# Patient Record
Sex: Male | Born: 2000 | Race: White | Hispanic: No | Marital: Single | Smoking: Never smoker
Health system: Southern US, Community
[De-identification: ages and names within clinical notes are randomized; demographics above are authoritative.]

## PROBLEM LIST (undated history)

## (undated) ENCOUNTER — Ambulatory Visit (HOSPITAL_COMMUNITY): Admission: EM

## (undated) DIAGNOSIS — I10 Essential (primary) hypertension: Secondary | ICD-10-CM

## (undated) DIAGNOSIS — R030 Elevated blood-pressure reading, without diagnosis of hypertension: Secondary | ICD-10-CM

## (undated) DIAGNOSIS — D573 Sickle-cell trait: Secondary | ICD-10-CM

## (undated) HISTORY — PX: NO PAST SURGERIES: SHX2092

---
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
## 2019-02-13 ENCOUNTER — Ambulatory Visit (INDEPENDENT_AMBULATORY_CARE_PROVIDER_SITE_OTHER): Payer: BC Managed Care – PPO

## 2019-02-13 ENCOUNTER — Other Ambulatory Visit: Payer: Self-pay

## 2019-02-13 ENCOUNTER — Ambulatory Visit
Admission: EM | Admit: 2019-02-13 | Discharge: 2019-02-13 | Disposition: A | Discharge status: Home / Self Care | Payer: BC Managed Care – PPO | Attending: Family Medicine | Admitting: Family Medicine

## 2019-02-13 ENCOUNTER — Encounter: Payer: Self-pay | Admitting: Emergency Medicine

## 2019-02-13 DIAGNOSIS — R0789 Other chest pain: Secondary | ICD-10-CM | POA: Diagnosis not present

## 2019-02-13 DIAGNOSIS — J029 Acute pharyngitis, unspecified: Secondary | ICD-10-CM

## 2019-02-13 DIAGNOSIS — Z20828 Contact with and (suspected) exposure to other viral communicable diseases: Secondary | ICD-10-CM | POA: Diagnosis not present

## 2019-02-13 DIAGNOSIS — R05 Cough: Secondary | ICD-10-CM

## 2019-02-13 DIAGNOSIS — J069 Acute upper respiratory infection, unspecified: Secondary | ICD-10-CM | POA: Diagnosis not present

## 2019-02-13 DIAGNOSIS — R519 Headache, unspecified: Secondary | ICD-10-CM

## 2019-02-13 DIAGNOSIS — Z7189 Other specified counseling: Secondary | ICD-10-CM | POA: Diagnosis not present

## 2019-02-13 DIAGNOSIS — Z20822 Contact with and (suspected) exposure to covid-19: Secondary | ICD-10-CM

## 2019-02-13 LAB — RAPID STREP SCREEN (MED CTR MEBANE ONLY): Streptococcus, Group A Screen (Direct): NEGATIVE

## 2019-02-13 MED ORDER — ALBUTEROL SULFATE HFA 108 (90 BASE) MCG/ACT IN AERS: 2.0000 | INHALATION_SPRAY | RESPIRATORY_TRACT | 0 refills | Status: DC | PRN

## 2019-02-13 NOTE — Discharge Instructions (Signed)
It was very nice seeing you today in clinic. Thank you for entrusting me with your care.  ° °Rest and Increase fluid intake as much as possible. Water is always best, as sugar and caffeine containing fluids can cause you to become dehydrated. Try to incorporate electrolyte enriched fluids, such as Gatorade or Pedialyte, into your daily fluid intake.  May use Tylenol and/or Ibuprofen as needed for pain/fever.  ° °You were tested for SARS-CoV-2 (novel coronavirus) today. Testing is performed by an outside lab ([facility]) and has variable turn around times ranging between 2-5 days. Current recommendations from the the CDC and [place] DHHS require that you remain out of work in order to quarantine at home until negative test results are have been received. In the event that your test results are positive, you will be contacted with further directives. These measures are being implemented out of an abundance of caution to prevent transmission and spread during the current SARS-CoV-2 pandemic. ° °Make arrangements to follow up with your regular doctor in 1 week for re-evaluation if not improving. If your symptoms/condition worsens, please seek follow up care either here or in the ER. Please remember, our [facility] providers are "right here with you" when you need us.  ° °Again, it was my pleasure to take care of you today. Thank you for choosing our clinic. I hope that you start to feel better quickly.  ° °Aireona Torelli, MSN, APRN, FNP-C, CEN °Advanced Practice Provider °[facility] °

## 2019-02-13 NOTE — ED Provider Notes (Signed)
[place], [place]   Name: Christopher Andrade DOB: 10-16-2000 MRN: [id number] CSN: [id number] PCP: Herb Grays, MD  Arrival date and time:  02/13/19 1430  Chief Complaint:  Headache, Generalized Body Aches, and Fever   NOTE: Prior to seeing the patient today, I have reviewed the triage nursing documentation and vital signs. Clinical staff has updated patient's PMH/PSHx, current medication list, and drug allergies/intolerances to ensure comprehensive history available to assist in medical decision making.   History:   HPI: Christopher Andrade is a 18 y.o. male who presents today with complaints of cough, sore throat, diffuse body myalgias, chest tightness, and low-grade fevers (Tmax 99.5) for the last 2 to 3 days.  With a past medical history significant for allergies, which is what he has attributed his symptoms to over the last few days; takes daily cetirizine.  Symptoms worsened last night.  He denies any pain in his ears or sinus pressure/pain. He denies that he has experienced any nausea, vomiting, diarrhea, or abdominal pain. He is eating and drinking well. Patient denies any perceived alterations to his sense of taste of smell.  Patient is employed by a [employer]) and is therefore in contact with many people on a daily basis.  He presents with concerns related to SARS-CoV-2 (novel coronavirus); he has never been tested per his report.  Past Medical History:  Diagnosis Date  . Sickle cell trait (HCC)     History reviewed. No pertinent surgical history.  Family History  Problem Relation Age of Onset  . Healthy Mother   . Hypertension Father     Social History   Tobacco Use  . Smoking status: Never Smoker  . Smokeless tobacco: Never Used  Substance Use Topics  . Alcohol use: No  . Drug use: No    There are no active problems to display for this patient.   Home Medications:    No outpatient medications have been marked as taking for the 02/13/19 encounter  [facility] Encounter).    Allergies:   Patient has no known allergies.  Review of Systems (ROS): Review of Systems  Constitutional: Positive for fever (Tmax 99.5). Negative for fatigue.  HENT: Positive for sore throat. Negative for congestion, drooling, ear pain, postnasal drip, rhinorrhea, sinus pressure, sinus pain, sneezing and trouble swallowing.   Eyes: Negative for pain, discharge and redness.  Respiratory: Positive for cough, chest tightness and wheezing. Negative for shortness of breath.   Cardiovascular: Negative for chest pain and palpitations.  Gastrointestinal: Negative for abdominal pain, diarrhea, nausea and vomiting.  Musculoskeletal: Positive for myalgias. Negative for arthralgias, back pain and neck pain.  Skin: Negative for color change, pallor and rash.  Neurological: Negative for dizziness, syncope, weakness and headaches.  Hematological: Positive for adenopathy.  Psychiatric/Behavioral: The patient is nervous/anxious.      Vital Signs: Today's Vitals   02/13/19 1438 02/13/19 1441 02/13/19 1543  BP:  (!) 157/84   Pulse:  88   Resp:  16   Temp:  98.2 F (36.8 C)   TempSrc:  Oral   SpO2:  100%   Weight: 185 lb (83.9 kg)    Height: 6\' 1"  (1.854 m)    PainSc: 3   3     Physical Exam: Physical Exam  Constitutional: He is oriented to person, place, and time and well-developed, well-nourished, and in no distress. No distress.  HENT:  Head: Normocephalic and atraumatic.  Right Ear: Tympanic membrane normal.  Left Ear: Tympanic membrane normal.  Nose: Mucosal edema and rhinorrhea (clear) present. No sinus tenderness.  Mouth/Throat: Uvula is midline and mucous membranes are normal. Oropharyngeal exudate (single small white patch to RIGHT tonsil) and posterior oropharyngeal erythema present. No posterior oropharyngeal edema.  Eyes: Pupils are equal, round, and reactive to light. Conjunctivae and EOM are normal.  Neck: Normal range of motion. Neck supple. No  tracheal deviation present.  Cardiovascular: Normal rate, regular rhythm, normal heart sounds and intact distal pulses. Exam reveals no gallop and no friction rub.  No murmur heard. Pulmonary/Chest: Effort normal. No respiratory distress. He has no decreased breath sounds. He has wheezes (expiratory). He has no rhonchi. He has no rales.  Abdominal: Soft. Normal appearance and bowel sounds are normal. He exhibits no distension. There is no hepatosplenomegaly. There is no abdominal tenderness.  Musculoskeletal: Normal range of motion.  Lymphadenopathy:       Head (right side): Submandibular adenopathy present.       Head (left side): Submandibular adenopathy present.  Neurological: He is alert and oriented to person, place, and time. Gait normal.  Skin: Skin is warm and dry. No rash noted. He is not diaphoretic.  Psychiatric: Memory, affect and judgment normal. His mood appears anxious.  Nursing note and vitals reviewed.   Urgent Care Treatments / Results:   LABS: PLEASE NOTE: all labs that were ordered this encounter are listed, however only abnormal results are displayed. Labs Reviewed  NOVEL CORONAVIRUS, NAA (HOSP ORDER, SEND-OUT TO REF LAB; TAT 18-24 HRS)  RAPID STREP SCREEN ([facility] [place] ONLY)  CULTURE, GROUP A STREP [facility])    EKG: -None  RADIOLOGY: No results found.  PROCEDURES: Procedures  MEDICATIONS RECEIVED THIS VISIT: Medications - No data to display  PERTINENT CLINICAL COURSE NOTES/UPDATES:   Initial Impression / Assessment and Plan / Urgent Care Course:  Pertinent labs & imaging results that were available during my care of the patient were personally reviewed by me and considered in my medical decision making (see lab/imaging section of note for values and interpretations).  Christopher Andrade is a 18 y.o. male who [facility] with complaints of Headache, Generalized Body Aches, and Fever   Patient overall well appearing and in no acute  distress today in clinic. Presenting symptoms (see HPI) and exam as documented above. He presents with symptoms associated with SARS-CoV-2 (novel coronavirus). Discussed typical symptom constellation. Reviewed potential for infection and need for testing. Patient amenable to being tested. SARS-CoV-2 swab collected by certified clinical staff. Discussed variable turn around times associated with testing, as sw[facility] are being [facility], and have been taking between 2-5 days to come back. He was advised to self quarantine, per [facility] DH[facility] guidelines, until negative results received.   Rapid streptococcal throat swab (-); reflex culture sent. Radiographs of the chest performed today revealed no acute cardiopulmonary process; no evidence of peribronchial thickening, areas of consolidation, or focal infiltrates. Symptoms consistent with viral illness with cough. SARS-CoV-2 remains part of the differential.  Offered anti-tussive medication to help with cough, however patient declined and is opting to use OTC interventions at this point. Exam revealed expiratory wheezing. Will send in SABA (albuterol) MDI for PRN use. I discussed with him that his symptoms are felt to be viral in nature, thus antibiotics would not offer him any relief or improve his symptoms any faster than conservative symptomatic management. Discussed supportive care measures at home during acute phase of illness. Patient to rest as much as possible. He was  encouraged to ensure adequate hydration (water and ORS) to prevent dehydration and electrolyte derangements. Patient may use APAP and/or IBU on an as needed basis for pain/fever. Patient to contact the clinic if her feels worse, or if new symptoms develop.  Current clinical condition warrants patient being out of work in order to quarantine while waiting for testing results.  He was provided with the appropriate documentation to provide to his place of employment that will allow for him to RTW  on 02/15/2019 with no restrictions. RTW is contingent on his SARS-CoV-2 test results being reviewed as negative.    Discussed follow up with primary care physician in 1 week for re-evaluation. I have reviewed the follow up and strict return precautions for any new or worsening symptoms. Patient is aware of symptoms that would be deemed urgent/emergent, and would thus require further evaluation either here or in the emergency department. At the time of discharge, he verbalized understanding and consent with the discharge plan as it was reviewed with him. All questions were fielded by provider and/or clinic staff prior to patient discharge.    Final Clinical Impressions / Urgent Care Diagnoses:   Final diagnoses:  Viral upper respiratory tract infection with cough  Encounter for laboratory testing for COVID-19 virus  Advice given about COVID-19 virus infection    New Prescriptions:  [place] Controlled Substance Registry consulted? Not Applicable  Meds ordered this encounter  Medications  . albuterol (VENTOLIN HFA) 108 (90 Base) MCG/ACT inhaler    Sig: Inhale 2 puffs into the lungs every 4 (four) hours as needed for wheezing.    Dispense:  18 g    Refill:  0    Recommended Follow up Care:  Patient encouraged to follow up with the following provider within the specified time frame, or sooner as dictated by the severity of his symptoms. As always, he was instructed that for any urgent/emergent care needs, he should seek care either here or in the emergency department for more immediate evaluation.  Follow-up Information    Tresea Mall, MD In 1 week.   Specialty: Pediatrics Why: General reassessment of symptoms if not improving Contact information: [place] [phone]         NOTE: This note was prepared using Dragon dictation software along with smaller phrase technology. Despite my best ability to proofread, there is the potential that transcriptional  errors may still occur from this process, and are completely unintentional.    Karen Kitchens, NP 02/14/19 [phone]

## 2019-02-13 NOTE — ED Triage Notes (Signed)
Patient c/o cough, bodyaches, runny nose, low grade fever and HAs that started 3 days ago.

## 2019-02-14 ENCOUNTER — Telehealth: Payer: Self-pay | Admitting: General Practice

## 2019-02-14 LAB — NOVEL CORONAVIRUS, NAA (HOSP ORDER, SEND-OUT TO REF LAB; TAT 18-24 HRS): SARS-CoV-2, NAA: NOT DETECTED

## 2019-02-14 NOTE — Telephone Encounter (Signed)
Patient informed of negative covid result. Patient verbalized understanding.   

## 2019-02-15 ENCOUNTER — Other Ambulatory Visit: Payer: Self-pay

## 2019-02-15 ENCOUNTER — Ambulatory Visit
Admission: EM | Admit: 2019-02-15 | Discharge: 2019-02-15 | Disposition: A | Discharge status: Home / Self Care | Payer: BC Managed Care – PPO | Attending: Urgent Care | Admitting: Urgent Care

## 2019-02-15 ENCOUNTER — Encounter: Payer: Self-pay | Admitting: Emergency Medicine

## 2019-02-15 DIAGNOSIS — Z20822 Contact with and (suspected) exposure to covid-19: Secondary | ICD-10-CM

## 2019-02-15 DIAGNOSIS — Z20828 Contact with and (suspected) exposure to other viral communicable diseases: Secondary | ICD-10-CM | POA: Diagnosis not present

## 2019-02-15 DIAGNOSIS — Z7189 Other specified counseling: Secondary | ICD-10-CM

## 2019-02-15 DIAGNOSIS — J069 Acute upper respiratory infection, unspecified: Secondary | ICD-10-CM | POA: Diagnosis not present

## 2019-02-15 LAB — RAPID INFLUENZA A&B ANTIGENS
Influenza A (ARMC): NEGATIVE
Influenza B (ARMC): NEGATIVE

## 2019-02-15 MED ORDER — PREDNISONE 20 MG PO TABS: 40.0000 mg | ORAL_TABLET | Freq: Every day | ORAL | Status: DC

## 2019-02-15 NOTE — Discharge Instructions (Signed)
It was very nice seeing you today in clinic. Thank you for entrusting me with your care.   Flu testing negative. Group A strep ruled out; throat culture final pending. You wanted to defer treatment with antibiotics for possible non-group A strep. I have sent you in a short course of steroids to help with your breathing. Continue cough medications.   You were tested for SARS-CoV-2 (novel coronavirus) today. Testing is performed by an outside lab ([facility]) and has variable turn around times ranging between 2-5 days. Current recommendations from the the CDC and [place] DHHS require that you remain out of work in order to quarantine at home until negative test results are have been received. In the event that your test results are positive, you will be contacted with further directives. These measures are being implemented out of an abundance of caution to prevent transmission and spread during the current SARS-CoV-2 pandemic.   Make arrangements to follow up with your regular doctor in 1 week for re-evaluation if not improving. If your symptoms/condition worsens, please seek follow up care either here or in the ER. Please remember, our [place] providers are "right here with you" when you need [place].   Again, it was my pleasure to take care of you today. Thank you for choosing our clinic. I hope that you start to feel better quickly.   Honor Loh, MSN, APRN, FNP-C, CEN [facility]

## 2019-02-15 NOTE — ED Triage Notes (Signed)
Pt c/o shortness of breath, pain in back when taking a deep breath, body aches, cough, "low grade fevers", headaches, he was tested for COVID 3 days ago and was negative. He wants to be retested for covid today. He called EMS last night because he could not breath and they did not feel he needed to go the ER. Also he was told that he could get prednisone if he was not any better.

## 2019-02-16 LAB — NOVEL CORONAVIRUS, NAA (HOSP ORDER, SEND-OUT TO REF LAB; TAT 18-24 HRS): SARS-CoV-2, NAA: NOT DETECTED

## 2019-02-16 LAB — CULTURE, GROUP A STREP (THRC)

## 2019-02-16 NOTE — ED Provider Notes (Signed)
[place], [place]   Name: Christopher Andrade DOB: 01/21/2001 MRN: [id number] CSN: [id number] PCP: Herb GraysBoylston, Yun, MD  Arrival date and time:  02/15/19 1141  Chief Complaint:  Shortness of Breath   NOTE: Prior to seeing the patient today, I have reviewed the triage nursing documentation and vital signs. Clinical staff has updated patient's PMH/PSHx, current medication list, and drug allergies/intolerances to ensure comprehensive history available to assist in medical decision making.   History:   HPI: Christopher Andrade is a 18 y.o. male who presents today with complaints of worsening symptoms following a visit on here on 02/13/2019. Patient was originally seen for evaluation by me for cough, SOB, low grade fevers, and myalgias. CXR revealed no peribronchial thickening, areas of consolidation, or focal infiltrates. He was prescribed an albuterol MDI to help with his chest tightness and SOB. He was diagnosed with an acute viral illness. Supportive care and follow up with his PCP was recommended. Since being seen, his SARS-CoV-2 (novel coronavirus) testing has resulted as negative.  Patient presents today advising that he feels like he is worse. He notes that someone told him that his SARS-CoV-2 testing likely resulted in a "false negative result". Patient states, "I am worse. I think I have COVID. I have to think about breathing instead of just doing it". Patient has been using the prescribed albuterol inhaler, which he advises "helps some". Patient reports that he called EMS out to his home last night because he "could not breathe for 15 minutes". EMS came out and assessed patient. Breathing and oxygen saturations were found to be normal. He was advised that there was no urgent need for him to go to the hospital. EMS personnel advised to follow up with his PCP. Patient's fiance contacted the clinic this morning adamant that patient be re-evaluated for worsening symptoms. She had firm requests for patient be tested  for SARS-CoV-2 despite a negative swab being collected on 02/13/2019. Patient has been out of work in order to maintain the recommended quarantine since he was seen here on 02/13/2019. He denies contact with anyone else known to be ill since previously being seen.   Patient continues to have an intermittent dry cough for which he is using OTC anti-tussives; refused prescription. He continues to intermittent wheeze despite use of the prescribed MDI. He notes that his throat is more sore than it has been since symptoms started. Myalgias have improved overall with the use of alternating doses of APAP and IBU. He is concerned because his Tmax has increased to 101. He denies that hehas experienced any nausea, vomiting, diarrhea, or abdominal pain. He is eating and drinking well. Patient denies any perceived alterations to his sense of taste or smell.     Past Medical History:  Diagnosis Date  . Sickle cell trait [facility](HCC)     Past Surgical History:  Procedure Laterality Date  . NO PAST SURGERIES      Family History  Problem Relation Age of Onset  . Healthy Mother   . Hypertension Father     Social History   Tobacco Use  . Smoking status: Never Smoker  . Smokeless tobacco: Never Used  Substance Use Topics  . Alcohol use: No  . Drug use: No    There are no active problems to display for this patient.   Home Medications:    Current Meds  Medication Sig  . albuterol (VENTOLIN HFA) 108 (90 Base) MCG/ACT inhaler Inhale 2 puffs into the lungs every 4 (four)  hours as needed for wheezing.    Allergies:   Patient has no known allergies.  Review of Systems (ROS): Review of Systems  Constitutional: Positive for fever (Tmax 101). Negative for fatigue.  HENT: Positive for sore throat. Negative for congestion, drooling, ear pain, postnasal drip, rhinorrhea, sinus pressure, sinus pain, sneezing and trouble swallowing.   Eyes: Negative for pain, discharge and redness.  Respiratory: Positive for  cough, chest tightness and wheezing. Negative for shortness of breath.   Cardiovascular: Negative for chest pain and palpitations.       Chronically elevated BP; "I have had hypertension since I was 13" - does not take medication.   Gastrointestinal: Negative for abdominal pain, diarrhea, nausea and vomiting.  Musculoskeletal: Positive for back pain and myalgias. Negative for arthralgias and neck pain.  Skin: Negative for color change, pallor and rash.  Neurological: Negative for dizziness, syncope, weakness and headaches.  Hematological: Positive for adenopathy.  Psychiatric/Behavioral: The patient is nervous/anxious.      Vital Signs: Today's Vitals   02/15/19 1214 02/15/19 1217 02/15/19 1323  BP:  (!) 179/80   Pulse:  (!) 102   Resp:  20   Temp:  98.4 F (36.9 C)   TempSrc:  Oral   SpO2:  99%   Weight: 184 lb 15.5 oz (83.9 kg)    PainSc: 6   6     Physical Exam: Physical Exam  Constitutional: He is oriented to person, place, and time and well-developed, well-nourished, and in no distress. No distress.  HENT:  Head: Normocephalic and atraumatic.  Right Ear: Tympanic membrane normal.  Left Ear: Tympanic membrane normal.  Nose: Mucosal edema present. No rhinorrhea or sinus tenderness.  Mouth/Throat: Uvula is midline and mucous membranes are normal. Posterior oropharyngeal erythema present. No posterior oropharyngeal edema.  Eyes: Pupils are equal, round, and reactive to light. Conjunctivae and EOM are normal.  Neck: Normal range of motion and full passive range of motion without pain. Neck supple. No tracheal deviation present.  Cardiovascular: Regular rhythm, normal heart sounds and intact distal pulses. Tachycardia present. Exam reveals no gallop and no friction rub.  No murmur heard. Pulmonary/Chest: Effort normal. No respiratory distress. He has no decreased breath sounds. He has wheezes (scattered expiratory; improved overall). He has no rhonchi. He has no rales.   Abdominal: Soft. Normal appearance and bowel sounds are normal. He exhibits no distension. There is no hepatosplenomegaly. There is no abdominal tenderness.  Musculoskeletal: Normal range of motion.     Lumbar back: He exhibits pain. He exhibits normal range of motion, no tenderness, no swelling, no deformity and no spasm.     Comments: Back pain is described as a "generalized soreness". No injuries. No midline pain or deformity.   Lymphadenopathy:       Head (right side): Submandibular adenopathy present.       Head (left side): Submandibular adenopathy present.  Neurological: He is alert and oriented to person, place, and time. Gait normal.  Skin: Skin is warm and dry. No rash noted. He is not diaphoretic.  Psychiatric: Memory, affect and judgment normal. His mood appears anxious.  Nursing note and vitals reviewed.   Urgent Care Treatments / Results:   LABS: PLEASE NOTE: all labs that were ordered this encounter are listed, however only abnormal results are displayed. Labs Reviewed  NOVEL CORONAVIRUS, NAA (HOSP ORDER, SEND-OUT TO REF LAB; TAT 18-24 HRS)  RAPID INFLUENZA A&B ANTIGENS ([facility] ONLY)   EKG: -None  RADIOLOGY: No results found.  PROCEDURES: Procedures  MEDICATIONS RECEIVED THIS VISIT: Medications - No data to display  PERTINENT CLINICAL COURSE NOTES/UPDATES:   Initial Impression / Assessment and Plan / Urgent Care Course:  Pertinent labs & imaging results that were available during my care of the patient were personally reviewed by me and considered in my medical decision making (see lab/imaging section of note for values and interpretations).  Christopher Andrade is a 18 y.o. male who presents to [facility] today with complaints of shortness of breath, fevers, body aches, cough, sore throat, and requests for repeat SARS-CoV-2 testing.   Patient overall well appearing and in no acute distress today in clinic. Presenting symptoms (see HPI) and exam as documented  above. While patient may feel worse, clinically he has improved. His myalgias have improved with APAP/IBU. Will continue current treatment as follows:  In response to patient's perception of worsening symptoms, the decision was made to test for influenza today.  Rapid influenza diagnotic test (RIDT) resulted (-) for both the influenza A and B Ag.   Request for repeat SARS-CoV-2 testing: patient adamant about being retested as he feels as if his initial tested produced a "false negative" result. Will oblige request.  Repeat SARS-CoV-2 swab collected by certified clinical staff.  Discussed variable turn around times associated with testing, as swabs are being processed at [facility], and have been between 2-5 days to come back.  He was advised to self quarantine, per [facility] DHHS guidelines, until negative results received.    Shortness of breath and cough: CXR normal on 02/13/2019. Breath sounds have improved overall, however there is some residual expiratory wheezing. No rhonchi. No clear indication for repeat. SPO2 99% on RA.   Offered prescription for cough medication, however once again refused.   Patient to continue OTC anti-tussives and albuterol MDI.   Will add short systemic steroid burst using prednisone 40 mg daily x 4 days.    Sore throat: given his continued fevers and worsening sore throat, I offered to proceed with treatment for presumed streptococcal pharyngitis.   Final results form throat culture obtained on 02/13/2019 still pending.   Patient declined treatment with ABX citing that he would rather wait on the culture results.   This is more than reasonable as I continue to feel as if his symptoms are related to an acute viral illness.    Myalgias and fever: discussed that these symptoms are generally seen with viral illnesses.   Recommended continued use of alternating doses of APAP and IBU.   Advised concurrent doses could be taken for severe pain.   Reinforced supportive  care measures at home. Patient to rest as much as possible. He was encouraged to ensure adequate hydration (water and ORS) to prevent dehydration and electrolyte derangements.    Current clinical condition warrants patient being out of work in order to quarantine while waiting for testing results.  He was provided with updated documentation to provide to his place of employment that will allow for him to RTW on 02/17/2019 with no restrictions. RTW is contingent on his SARS-CoV-2 test results being reviewed as negative.    Discussed follow up with primary care physician in 1 week for re-evaluation. I have reviewed the follow up and strict return precautions for any new or worsening symptoms. Patient is aware of symptoms that would be deemed urgent/emergent, and would thus require further evaluation either here or in the emergency department. At the time of discharge, he verbalized understanding and consent with the discharge  plan as it was reviewed with him. All questions were fielded by provider and/or clinic staff prior to patient discharge.    Final Clinical Impressions / Urgent Care Diagnoses:   Final diagnoses:  Viral URI with cough  Encounter for laboratory testing for COVID-19 virus  Advice given about COVID-19 virus infection    New Prescriptions:  [place] Controlled Substance Registry consulted? Not Applicable  Meds ordered this encounter  Medications  . predniSONE (DELTASONE) 20 MG tablet    Sig: Take 2 tablets (40 mg total) by mouth daily.    Dispense:  8 tablet    Refill:  00    Recommended Follow up Care:  Patient encouraged to follow up with the following provider within the specified time frame, or sooner as dictated by the severity of his symptoms. As always, he was instructed that for any urgent/emergent care needs, he should seek care either here or in the emergency department for more immediate evaluation.  Follow-up Information    Herb Grays, MD In 1 week.   Specialty:  Pediatrics Why: General reassessment of symptoms if not improving Contact information: [address] [place] [address] [phone]         NOTE: This note was prepared using Dragon dictation software along with smaller phrase technology. Despite my best ability to proofread, there is the potential that transcriptional errors may still occur from this process, and are completely unintentional.    Verlee Monte, NP 02/16/19 2010

## 2019-05-05 ENCOUNTER — Ambulatory Visit
Admission: EM | Admit: 2019-05-05 | Discharge: 2019-05-05 | Disposition: A | Discharge status: Home / Self Care | Payer: BC Managed Care – PPO | Attending: Emergency Medicine | Admitting: Emergency Medicine

## 2019-05-05 ENCOUNTER — Other Ambulatory Visit: Payer: Self-pay

## 2019-05-05 ENCOUNTER — Encounter: Payer: Self-pay | Admitting: Emergency Medicine

## 2019-05-05 DIAGNOSIS — R030 Elevated blood-pressure reading, without diagnosis of hypertension: Secondary | ICD-10-CM

## 2019-05-05 DIAGNOSIS — R42 Dizziness and giddiness: Secondary | ICD-10-CM

## 2019-05-05 DIAGNOSIS — F410 Panic disorder [episodic paroxysmal anxiety] without agoraphobia: Secondary | ICD-10-CM

## 2019-05-05 DIAGNOSIS — R079 Chest pain, unspecified: Secondary | ICD-10-CM

## 2019-05-05 HISTORY — DX: Essential (primary) hypertension: I10

## 2019-05-05 MED ORDER — HYDROXYZINE HCL 25 MG PO TABS: 25.0000 mg | ORAL_TABLET | Freq: Two times a day (BID) | ORAL | 0 refills | Status: AC | PRN

## 2019-05-05 NOTE — Discharge Instructions (Addendum)
Take medication as prescribed. Rest. Drink plenty of fluids. Avoid stress as able.   Follow-up with your counselor on Tuesday as scheduled.  Recommend for you to have close follow-up with your primary care this coming week, following up from today as well as a follow-up from recent start on blood pressure medication.  Return to urgent care as needed.  Proceed directly to the emergency room for any worsening complaints.  As discussed if this reoccurs be seen promptly.

## 2019-05-05 NOTE — ED Provider Notes (Signed)
[facility] ____________________________________________  Time seen: Approximately 3:28 PM  I have reviewed the triage vital signs and the nursing notes.  HISTORY  Chief Complaint Chest Pain  HPI Christopher Andrade is a 18 y.o. male presenting for evaluation of episode of chest pain that he had today.  Patient reports he has had a very stressful day today.  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  Reports he was trying quickly to get everything ready.  Reports he went to work at 7 this morning and while dealing with a work stress he began to have chest pressure around 9 AM. Felt fine first thing this morning. Reports pressure was mid to left chest.  Denies stabbing piercing, paresthesias or pain radiation sensation.  No shortness of breath initially.  Patient reports approximately 1 or 2 hours later he began having increased chest pressure and became very anxious.  Patient reports initially he is able to do deep breathing and calm down and resolved the shortness of breath and chest pressure, but reports as he continued working it returned.  Patient reports that this feels like previous panic attacks.  Patient reports currently he is completely asymptomatic after coming in and talking with the nurses.  Patient reports that he called the nurse hotline number this morning when it first started happening and they told him to call 911 which he reports further increase his anxiety and stress.  Again states that this time feels normal.  Denies any chest pain, shortness of breath, abdominal pain, vomiting, paresthesias, weakness, dizziness.  States still stressed but feeling better.  Is currently undergoing twice week counseling due to stress.  Patient reports in the past his counselor at [facility] discussed starting a medication to help control anxiety as needed, but reports patient did not want a talk about it.  States today he is open to this idea.  Denies  drug or alcohol use.  States has very limited caffeine intake.  Denies other complaints.  Denies cough, congestion, vomiting, diarrhea.  Of note patient was seen by his primary care office 1 week ago and started on 10 mg lisinopril due to blood pressure being elevated.  Of note patient has previously been followed by [facility] pediatric cardiology after having intermittent chest pain and elevated blood pressure readings through his primary office.  Patient had echocardiogram completed in May 2019 in was described as normal in cardiology note.    Past Medical History:  Diagnosis Date  . Hypertension   . Sickle cell trait (HCC)     There are no problems to display for this patient.   Past Surgical History:  Procedure Laterality Date  . NO PAST SURGERIES       No current facility-administered medications for this encounter.  Current Outpatient Medications:  .  lisinopril (ZESTRIL) 10 MG tablet, Take 10 mg by mouth daily., Disp: , Rfl:  .  hydrOXYzine (ATARAX/VISTARIL) 25 MG tablet, Take 1 tablet (25 mg total) by mouth 2 (two) times daily as needed for anxiety., Disp: 20 tablet, Rfl: 0  Allergies Patient has no known allergies.  Family History  Problem Relation Age of Onset  . Healthy Mother   . Hypertension Father   Family history of hypoplastic left heart-brother Father: HTN  Social History Social History   Tobacco Use  . Smoking status: Never Smoker  . Smokeless tobacco: Never Used  Substance Use Topics  . Alcohol use: No  .  Drug use: No    Review of Systems Constitutional: No fever ENT: No sore throat. Cardiovascular: Positive chest pain. Respiratory: Positive shortness of breath. Gastrointestinal: No abdominal pain.  No nausea, no vomiting.  No diarrhea.   Genitourinary: Negative for dysuria. Musculoskeletal: Negative for back pain. Skin: Negative for rash. Neurological: Negative for focal weakness or  numbness.   ____________________________________________   PHYSICAL EXAM:  VITAL SIGNS: ED Triage Vitals  Enc Vitals Group     BP 05/05/19 1458 (!) 159/93     Pulse Rate 05/05/19 1458 100     Resp 05/05/19 1458 (!) 24     Temp --      Temp src --      SpO2 05/05/19 1458 100 %     Weight 05/05/19 1505 185 lb (83.9 kg)     Height 05/05/19 1505 6\' 1"  (1.854 m)     Head Circumference --      Peak Flow --      Pain Score --      Pain Loc --      Pain Edu? --      Excl. in GC? --    Vitals:   05/05/19 1508 05/05/19 1554 05/05/19 1603 05/05/19 1607  BP: 139/70 130/76    Pulse: 88 79  86  Resp: 18   18  Temp:   97.6 F (36.4 C)   TempSrc:   Oral   SpO2: 100%   100%  Weight:      Height:         Constitutional: Alert and oriented. Well appearing and in no acute distress. Eyes: Conjunctivae are normal.  ENT      Head: Normocephalic and atraumatic. Cardiovascular: Normal rate, regular rhythm. Grossly normal heart sounds.  Good peripheral circulation. Respiratory: Normal respiratory effort without tachypnea nor retractions. Breath sounds are clear and equal bilaterally. No wheezes, rales, rhonchi. Gastrointestinal: Soft and nontender.No CVA tenderness. Musculoskeletal: Steady gait. No midline cervical, thoracic or lumbar tenderness to palpation. Neurologic:  Normal speech and language. Speech is normal. No gait instability.  Skin:  Skin is warm, dry and intact. No rash noted. Psychiatric: Mood and affect are normal. Speech and behavior are normal. Patient exhibits appropriate insight and judgment   ___________________________________________   LABS (all labs ordered are listed, but only abnormal results are displayed)  Labs Reviewed - No data to display   Of note [facility] including this 5 of 2019: Date: 10/01/2017  ASSESSMENT: Real Christopher Andrade is a 18 y.o. 8 m.o. young man who was seen in consultation  [facility] for evaluation of elevated blood pressures who also has chest pain and lightheadedness. Christopher Andrade has a normal echocardiogram at today's visit without any evidence of ventricular hypertrophy or coarctation of the aorta. Should there continue to be concern for hypertension, then Christopher Andrade may benefit from referral to Pediatric Nephrology by his primary care physician. Regardless, Christopher Andrade will benefit from dietary changes and routine exercise. Christopher Andrade has a reassuring history, physical examination, electrocardiogram, and echocardiogram. I do not feel that his chest pain is cardiac in etiology. I discussed other etiologies of chest pain with Christopher Andrade and his mother. Should the chest pain continue, I have instructed them to contact their primary care physician for workup of these other etiologies. I have instructed Tajuan to significantly increase his fluid intake and decrease his caffeine intake. With these maneuvers, I feel that he will have a significant  improvement in his symptoms. I also instructed Laymon that when he has symptoms, he should appropriately put his head down and not ignore them. Otherwise, I do not feel that Jabari requires any further follow-up in the [facility]. He will have no cardiac restrictions in his activity. He does not require SBE prophylaxis. Secondary to the family history of hypoplastic left heart syndrome, first degree relatives should undergo cardiac evaluation.  Plan:   RECOMMENDATIONS: Increase fluid intake, decrease caffeine intake No further follow-up in the [facility]. No cardiac restrictions in activity.  No SBE prophylaxis. ____________________________________________  EKG  ED ECG REPORT I, Marylene Land, the attending provider, personally viewed and interpreted this ECG.   Date: 05/05/2019  EKG Time: 1502  Rate: 84  Rhythm: Normal sinus rhythm  Axis: normal  Intervals:none  ST&T  Change: none ___________________________________________  RADIOLOGY  No results found.   Echocardiogram results reviewed by care everywhere       [facility]  [address] [place],  [id number]        [phone]  NAME:    FRANS VALENTE [facility]  DOB: 2001/03/25 Ht: 187.0 cm PT ID#:   [id number]       Age: 59 years Wt: 86.7 kg STUDY DATE: 10/01/2017 10:49:07 AM Sex: M    BSA: 2.12 m                          BP: 156/77 mmHg Referring Physician: [id number] PAMELA SUE RO Sonographer:     Mickle Plumb  CPT Codes:    [id number] Complete noncongenital echocardiogram  Indications:   Essential hypertension - I10; Family history of congenital          anomalies-Z82.79 Diagnosis:    Hypertension without heart failure-I11.9; Family history of          congenital anomalies-Z82.79 Study Information The images were of adequate diagnostic quality. Study Location:  [facility]  Summary:  1. Non-hypertrophied left ventricle.  2. Normal left ventricular cavity size and systolic function.  3. No coarctation of the aorta.  4. Trileaflet aortic valve.  5. Normal right ventricular cavity size and systolic function.  6. Structurally normal heart   Normal echocardiogram. ____________________________________________   PROCEDURES Procedures     INITIAL IMPRESSION / ASSESSMENT AND PLAN / ED COURSE  Pertinent labs & imaging results that were available during my care of the patient were reviewed by me and considered in my medical decision making (see chart for details).  Very well-appearing patient at this time.  Patient calm cooperative and declines any pain or discomfort.  States he is feeling much better.  Patient reports that earlier he felt that he had a panic attack as that is similar to his previous panic attacks.  EKG normal.  Patient is currently on  lisinopril and recently started, however reports has been handling this well.  No recent sickness.  Discussed evaluation conservatively of labs, patient declined.  Strongly encouraged if recurrence of symptoms to be reevaluated and have laboratory studies at that time.  Encouraged to continue with his counseling and stress management coping skills.  Discussed starting on hydroxyzine as needed, patient agrees.  Supportive care and encouraged close primary follow-up.  Discussed follow up with Primary care physician this week. Discussed follow up and return parameters including no resolution or any worsening concerns. Patient verbalized understanding and agreed to plan.  ____________________________________________   FINAL CLINICAL IMPRESSION(S) / ED DIAGNOSES  Final diagnoses:  Chest pain, unspecified type  Panic attack     ED Discharge Orders         Ordered    hydrOXYzine (ATARAX/VISTARIL) 25 MG tablet  2 times daily PRN     05/05/19 1558           Note: This dictation was prepared with Dragon dictation along with smaller phrase technology. Any transcriptional errors that result from this process are unintentional.         Renford Dills, NP 05/05/19 1740

## 2019-05-05 NOTE — ED Triage Notes (Signed)
Patient c/o chest pain that started this morning. He started Lisinopril 1 week ago. He called the online nurse and she stated to call 911 but he came here instead.

## 2019-07-21 ENCOUNTER — Ambulatory Visit: Payer: BC Managed Care – PPO | Attending: Internal Medicine

## 2019-07-21 DIAGNOSIS — Z23 Encounter for immunization: Secondary | ICD-10-CM

## 2019-07-21 NOTE — Progress Notes (Signed)
   Covid-19 Vaccination Clinic  Name:  Christopher Andrade    MRN: [id number] DOB: 11-09-2000  07/21/2019  [name] was observed post Covid-19 immunization for 15 minutes without incident. He was provided with Vaccine Information Sheet and instruction to access the V-Safe system.   [name] was instructed to call 911 with any severe reactions post vaccine: Marland Kitchen Difficulty breathing  . Swelling of face and throat  . A fast heartbeat  . A bad rash all over body  . Dizziness and weakness   Immunizations Administered    Name Date Dose VIS Date Route   Pfizer COVID-19 Vaccine 07/21/2019 12:04 PM 0.3 mL 04/15/2019 Intramuscular   Manufacturer: [employer], [employer]   Lot: [id number]   NDC: [address]-2

## 2019-08-15 ENCOUNTER — Ambulatory Visit: Payer: BC Managed Care – PPO | Attending: Internal Medicine

## 2019-08-15 DIAGNOSIS — Z23 Encounter for immunization: Secondary | ICD-10-CM

## 2019-08-15 NOTE — Progress Notes (Signed)
   Covid-19 Vaccination Clinic  Name:  JONATHANDAVID MARLETT    MRN: [id number] DOB: [birth year]  08/15/2019  Mr. Kurtenbach was observed post Covid-19 immunization for 15 minutes without incident. He was provided with Vaccine Information Sheet and instruction to access the V-Safe system.   Mr. Eastridge was instructed to call 911 with any severe reactions post vaccine: Marland Kitchen Difficulty breathing  . Swelling of face and throat  . A fast heartbeat  . A bad rash all over body  . Dizziness and weakness   Immunizations Administered    Name Date Dose VIS Date Route   Pfizer COVID-19 Vaccine 08/15/2019 10:39 AM 0.3 mL 04/15/2019 Intramuscular   Manufacturer: [employer], [employer]   Lot: [id number]   NDC: [address]-8

## 2019-09-04 IMAGING — DX DG KNEE COMPLETE 4+V*R*
4 series · 4 of 4 positions shown · non-contrast
Comparison: None.

CLINICAL DATA: Right knee pain after fall

EXAM:
RIGHT KNEE - COMPLETE 4+ VIEW

[knee ap]
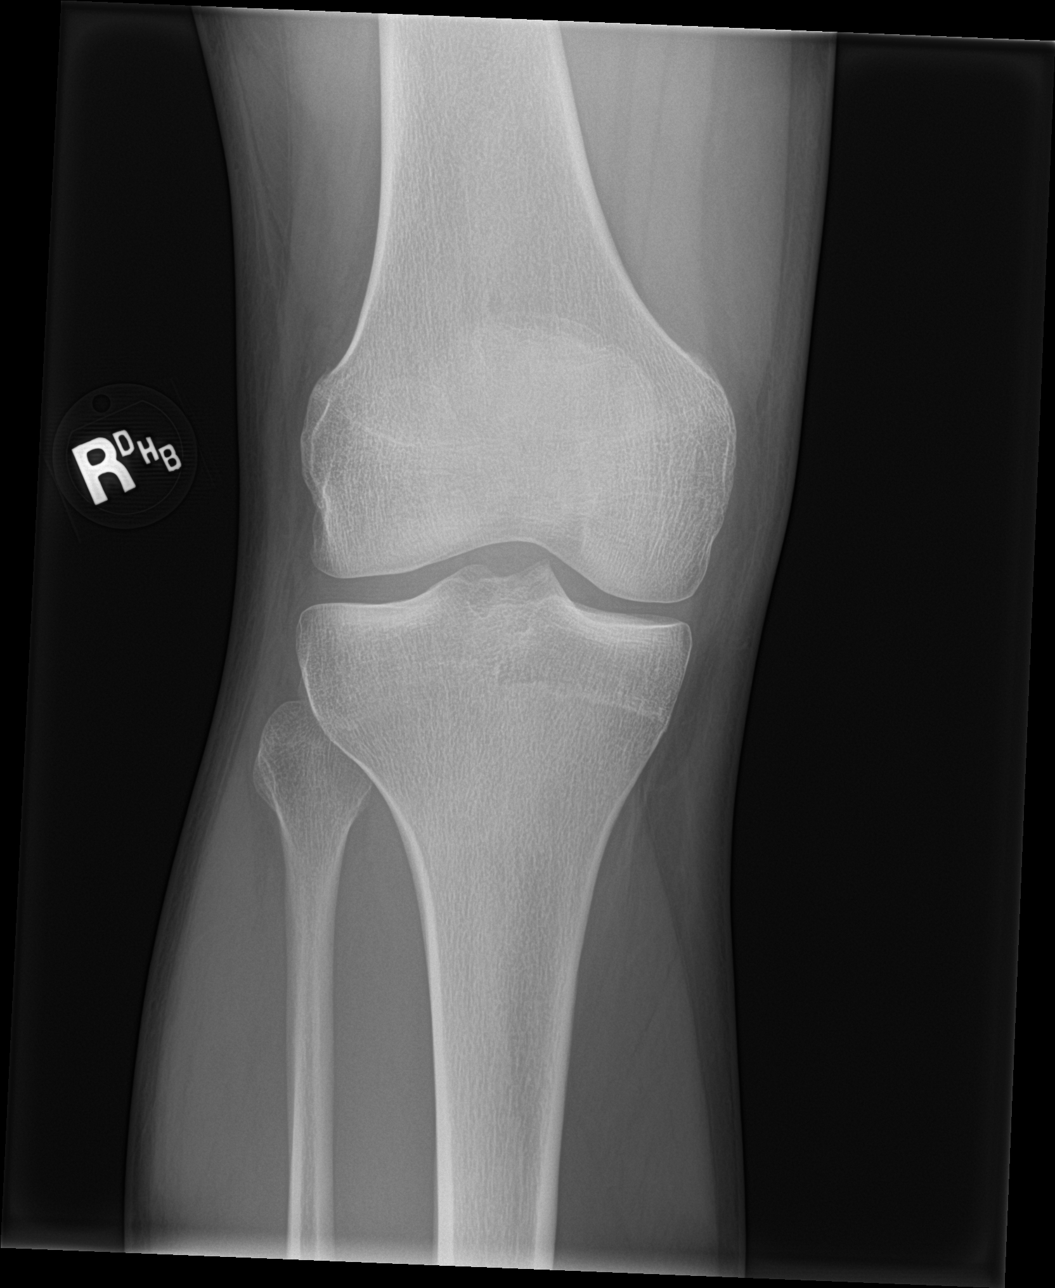

[knee lat]
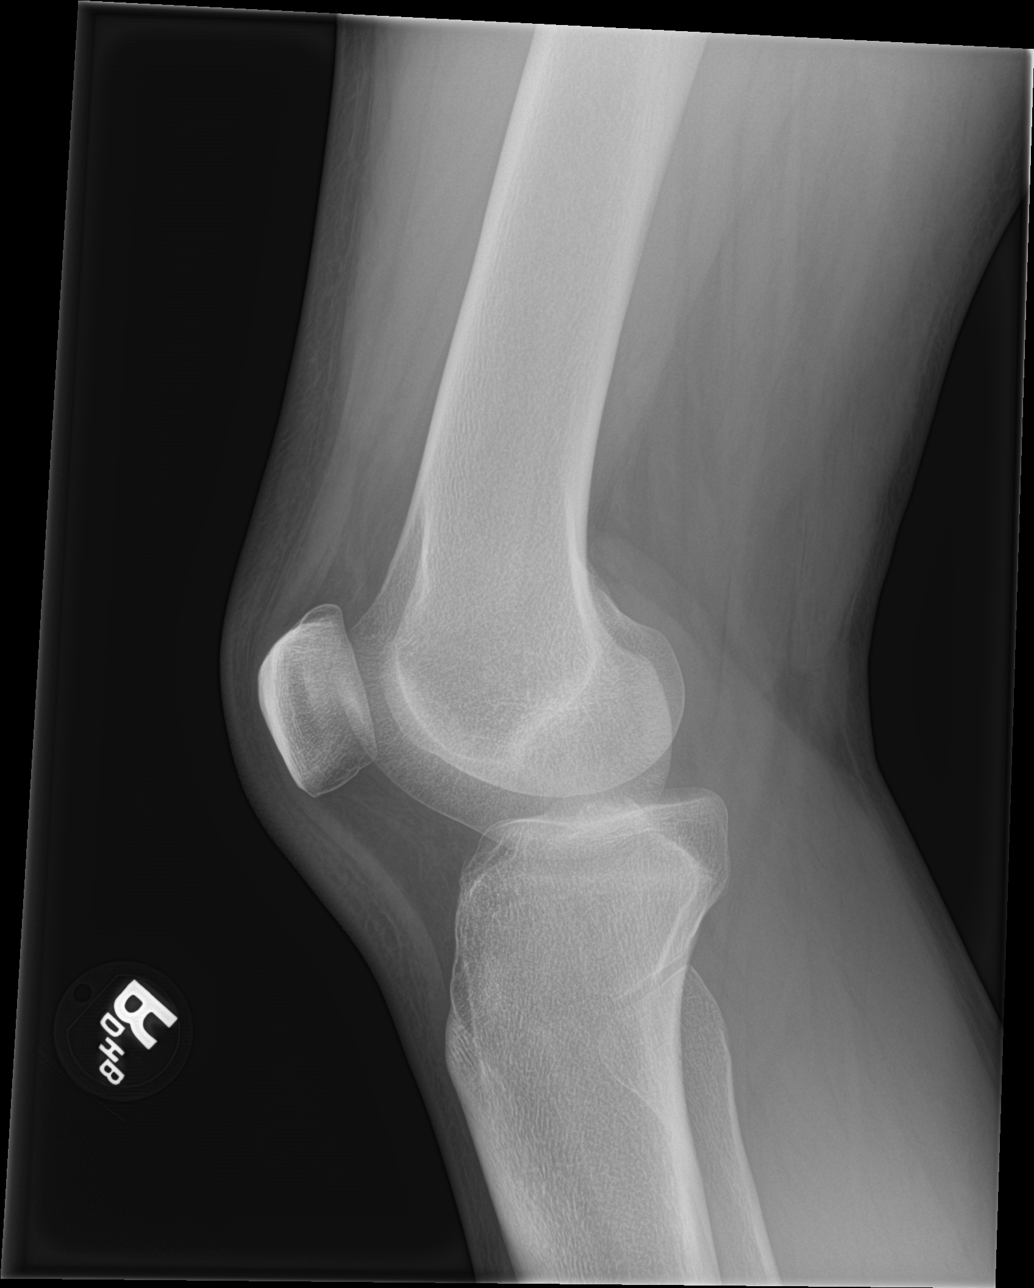

[knee obl (1 of 2)]
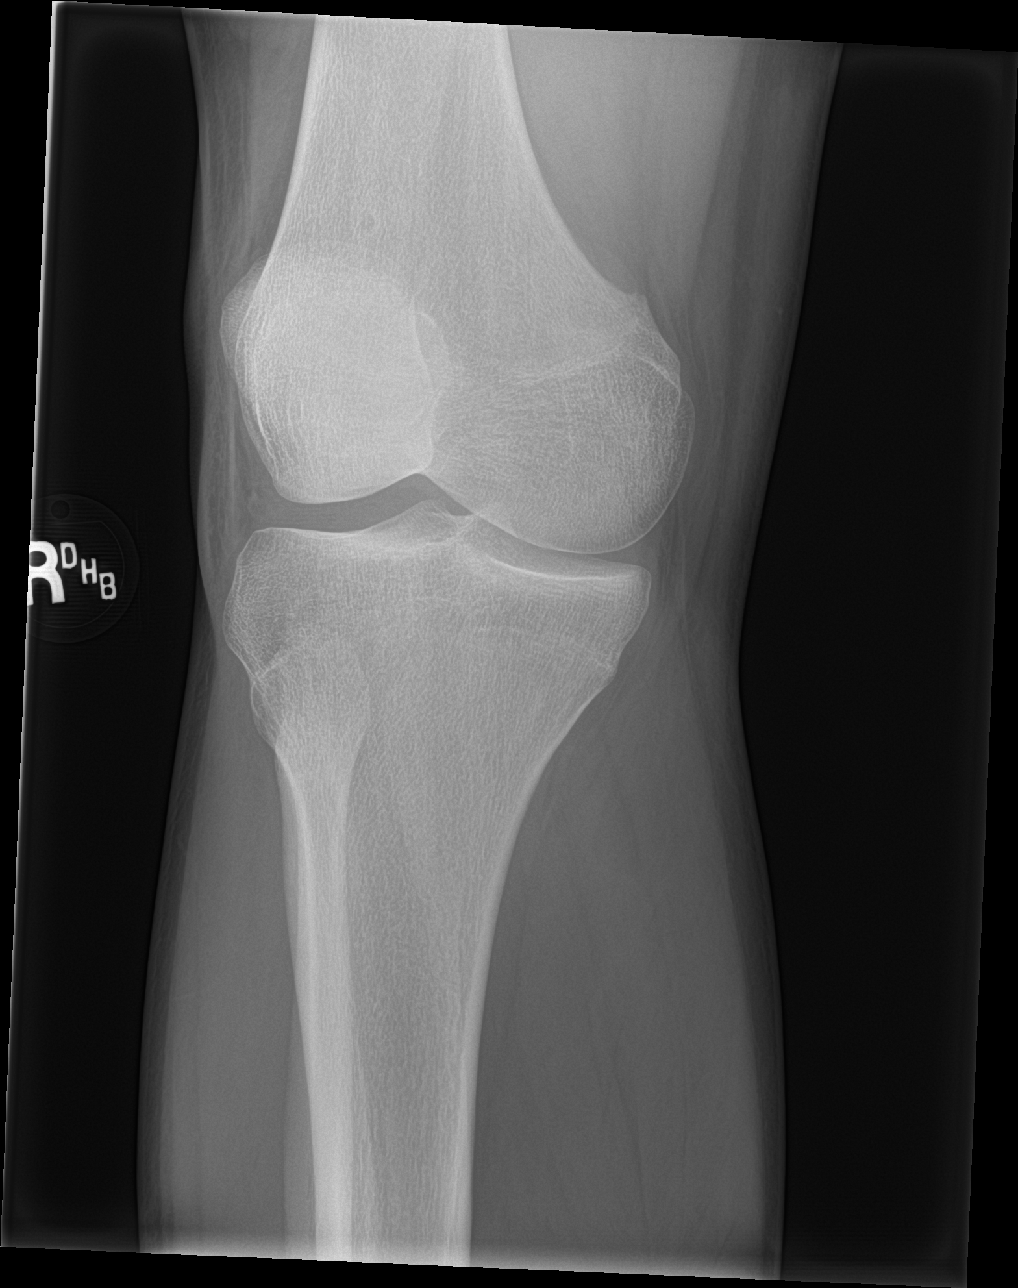

[knee obl (2 of 2)]
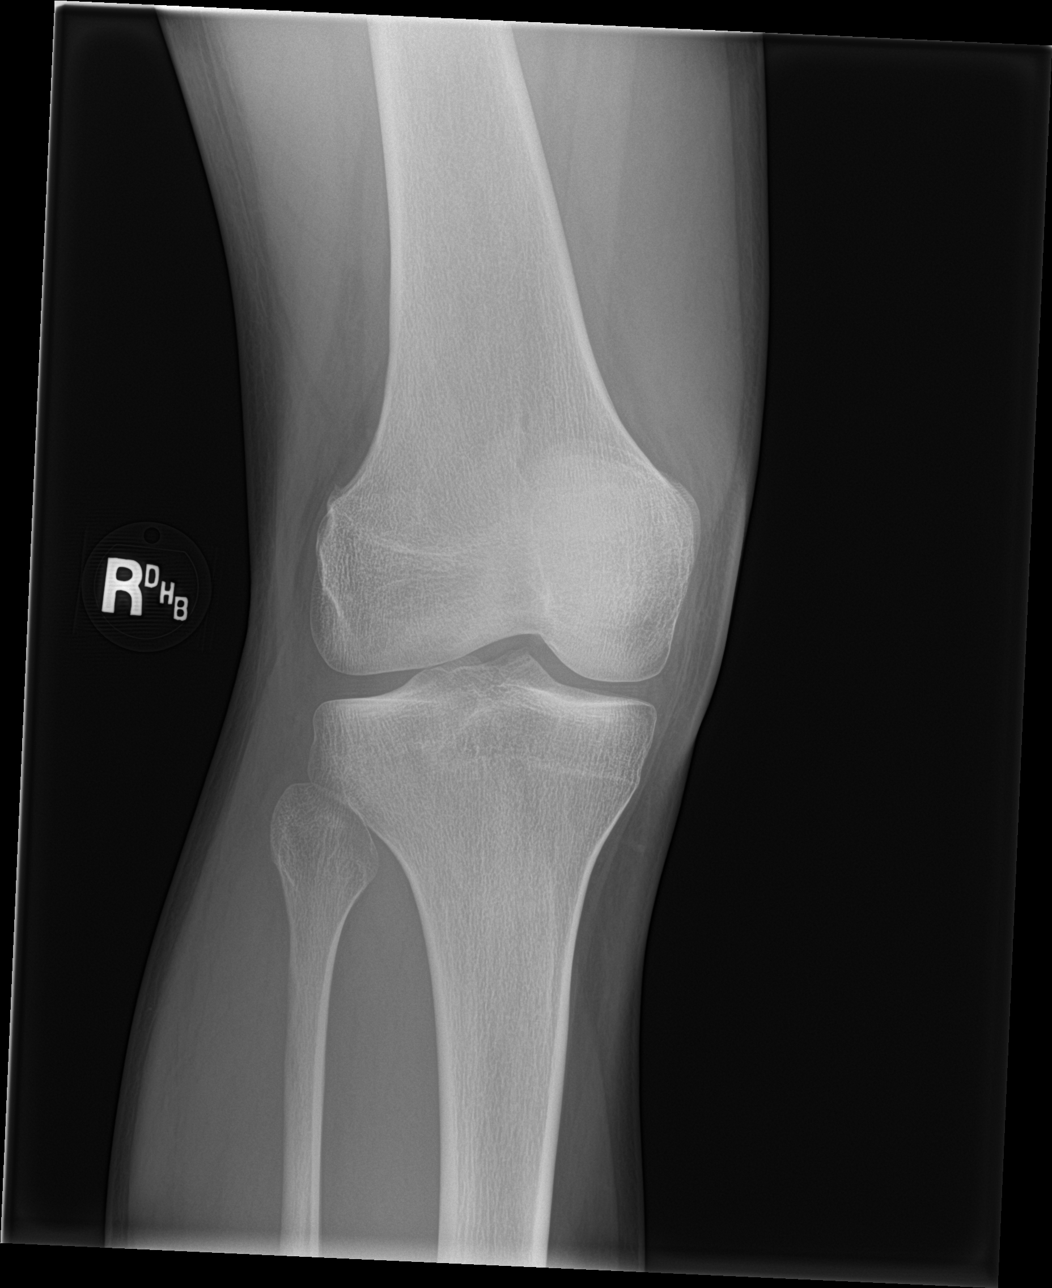

[4 of 4 positions shown; findings below may reference images not displayed]

FINDINGS: No evidence of fracture, dislocation, or joint effusion. No evidence
of arthropathy or other focal bone abnormality. Soft tissues are
unremarkable.
IMPRESSION: Negative.

## 2020-05-03 ENCOUNTER — Other Ambulatory Visit: Payer: Self-pay

## 2020-05-03 ENCOUNTER — Ambulatory Visit
Admission: EM | Admit: 2020-05-03 | Discharge: 2020-05-03 | Disposition: A | Discharge status: Home / Self Care | Payer: BC Managed Care – PPO | Attending: Physician Assistant | Admitting: Physician Assistant

## 2020-05-03 DIAGNOSIS — Z20822 Contact with and (suspected) exposure to covid-19: Secondary | ICD-10-CM | POA: Diagnosis present

## 2020-05-03 HISTORY — DX: Elevated blood-pressure reading, without diagnosis of hypertension: R03.0

## 2020-05-03 LAB — RESP PANEL BY RT-PCR (FLU A&B, COVID) ARPGX2
Influenza A by PCR: NEGATIVE
Influenza B by PCR: NEGATIVE
SARS Coronavirus 2 by RT PCR: NEGATIVE

## 2020-05-03 NOTE — ED Triage Notes (Signed)
Patient presents to Urgent Care with complaints of sore throat, nasal congestion symptoms started today. Patient reports had a covid exposure.   Denies fever, N/V or diarrhea.

## 2020-05-03 NOTE — Discharge Instructions (Signed)
You may take any over the counter meds for cold as needed

## 2020-05-03 NOTE — ED Provider Notes (Signed)
[facility]    CSN: [id number] Arrival date & time: 05/03/20  1523      History   Chief Complaint Chief Complaint  Patient presents with  . Sore Throat  . Nasal Congestion    HPI Christopher Andrade is a 19 y.o. male who presents due to developeing nose congestion and ST since today. Was exposed to someone with covid on 3 days ago with whom he was in the care for 45 min and no mask  Denies sweats, CP, HA, ST, appetite changes.     Past Medical History:  Diagnosis Date  . Hypertension   . Sickle cell trait (HCC)     There are no problems to display for this patient.   Past Surgical History:  Procedure Laterality Date  . NO PAST SURGERIES         Home Medications    Prior to Admission medications   Medication Sig Start Date End Date Taking? Authorizing Provider  lisinopril (ZESTRIL) 10 MG tablet Take 10 mg by mouth daily.   Yes [provider]  hydrOXYzine (ATARAX/VISTARIL) 25 MG tablet Take 1 tablet (25 mg total) by mouth 2 (two) times daily as needed for anxiety. 05/05/19   Renford Dills, NP  albuterol (VENTOLIN HFA) 108 (90 Base) MCG/ACT inhaler Inhale 2 puffs into the lungs every 4 (four) hours as needed for wheezing. 02/13/19 05/05/19  Verlee Monte, NP    Family History Family History  Problem Relation Age of Onset  . Healthy Mother   . Hypertension Father     Social History Social History   Tobacco Use  . Smoking status: Never Smoker  . Smokeless tobacco: Never Used  Vaping Use  . Vaping Use: Never used  Substance Use Topics  . Alcohol use: No  . Drug use: No     Allergies   Patient has no known allergies.   Review of Systems Review of Systems  Constitutional: Negative for appetite change, chills, diaphoresis, fatigue and fever.  HENT: Positive for congestion, postnasal drip, sore throat and voice change. Negative for ear discharge, ear pain and rhinorrhea.   Eyes: Negative for discharge.  Respiratory: Negative for  cough and shortness of breath.   Gastrointestinal: Negative for abdominal pain, diarrhea, nausea and vomiting.  Musculoskeletal: Negative for gait problem and myalgias.  Skin: Negative for rash.  Neurological: Negative for headaches.  Hematological: Negative for adenopathy.  Psychiatric/Behavioral: The patient is nervous/anxious.      Physical Exam Triage Vital Signs ED Triage Vitals  Enc Vitals Group     BP 05/03/20 1723 (S) (!) 163/90     Pulse Rate 05/03/20 1723 (!) 142     Resp 05/03/20 1723 16     Temp 05/03/20 1723 98.4 F (36.9 C)     Temp Source 05/03/20 1723 Oral     SpO2 05/03/20 1723 100 %     Weight 05/03/20 1720 200 lb (90.7 kg)     Height --      Head Circumference --      Peak Flow --      Pain Score 05/03/20 1720 0     Pain Loc --      Pain Edu? --      Excl. in GC? --    No data found.  Updated Vital Signs BP (S) (!) 163/90 (BP Location: Right Arm)   Pulse (!) 142   Temp 98.4 F (36.9 C) (Oral)   Resp 16   Wt 200  lb (90.7 kg)   SpO2 100%   BMI 26.39 kg/m   Visual Acuity Right Eye Distance:   Left Eye Distance:   Bilateral Distance:    Right Eye Near:   Left Eye Near:    Bilateral Near:     Physical Exam Physical Exam Vitals signs and nursing note reviewed.  Constitutional:      General: he is not in acute distress.    Appearance: Normal appearance. he is not ill-appearing, toxic-appearing or diaphoretic.  HENT:     Head: Normocephalic.     Right Ear: Tympanic membrane, ear canal and external ear normal.     Left Ear: Tympanic membrane, ear canal and external ear normal.     Nose: Nose normal.     Mouth/Throat: clear    Mouth: Mucous membranes are moist.  Eyes:     General: No scleral icterus.       Right eye: No discharge.        Left eye: No discharge.     Conjunctiva/sclera: Conjunctivae normal.  Neck:     Musculoskeletal: Neck supple. No neck rigidity.  Cardiovascular:     Rate and Rhythm: Normal rate and regular rhythm.  Rate down to   90    Heart sounds: No murmur.  Pulmonary:     Effort: Pulmonary effort is normal.     Breath sounds: Normal breath sounds.  .  Musculoskeletal: Normal range of motion.  Lymphadenopathy:     Cervical: No cervical adenopathy.  Skin:    General: Skin is warm and dry.     Coloration: Skin is not jaundiced.     Findings: No rash.  Neurological:     Mental Status:he is alert and oriented to person, place, and time.     Gait: Gait normal.  Psychiatric:        Mood and Affect: Mood normal.        Behavior: Behavior normal.        Thought Content: Thought content normal.        Judgment: Judgment normal.   He declined getting his BP retaken since this just causes him to worry more about his health and the numbers end up getting higher.   UC Treatments / Results  Labs (all labs ordered are listed, but only abnormal results are displayed) Labs Reviewed  RESP PANEL BY RT-PCR (FLU A&B, COVID) [id number]  Covid and flu test are neg.   EKG   Radiology No results found.  Procedures Procedures (including critical care time)  Medications Ordered in UC Medications - No data to display  Initial Impression / Assessment and Plan / UC Course  I have reviewed the triage vital signs and the nursing notes. Early URI. See instructions Final Clinical Impressions(s) / UC Diagnoses   Final diagnoses:  None   Discharge Instructions   None    ED Prescriptions    None     PDMP not reviewed this encounter.   Garey Ham, PA-C 05/03/20 1821

## 2020-06-12 ENCOUNTER — Other Ambulatory Visit: Payer: Self-pay

## 2020-06-12 ENCOUNTER — Ambulatory Visit
Admission: RE | Admit: 2020-06-12 | Discharge: 2020-06-12 | Disposition: A | Discharge status: Home / Self Care | Payer: BLUE CROSS/BLUE SHIELD | Source: Ambulatory Visit | Attending: Pediatrics | Admitting: Pediatrics

## 2020-06-12 VITALS — BP 160/85 | HR 150 | Temp 98.5°F | Resp 18 | Ht 73.0 in | Wt 200.0 lb

## 2020-06-12 DIAGNOSIS — M791 Myalgia, unspecified site: Secondary | ICD-10-CM | POA: Diagnosis not present

## 2020-06-12 NOTE — ED Provider Notes (Signed)
[facility]    CSN: [id number] Arrival date & time: 06/12/20  1146      History   Chief Complaint Chief Complaint  Patient presents with  . Elbow Pain    HPI Christopher Andrade is a 20 y.o. male.   HPI   20 year old male here for evaluation of bilateral elbow pain.  Patient reports that he has had pain for the last 3 to 5 days.  The pain began the day after he returned to the gym for the first time to do weight lifting with some coworkers.  Patient reports that he did high reps with high sets of varying weights.  Patient states that he has got pain in the antecubital aspect of both elbows.  The pain has continued to get better over the last 3 to 5 days.  Patient has taken Advil on occasion which has helped but not on a regular basis.  Patient denies numbness, tingling, or weakness.  Patient's heart rate was 150 at triage.  Patient has a history of whitecoat syndrome and anxiety.  Patient's heart rate rechecked during assessment and has normalized to 88 bpm.  Past Medical History:  Diagnosis Date  . Hypertension   . Sickle cell trait (HCC)   . White coat syndrome without diagnosis of hypertension    since childhood, secondary to loosing his infant brother at age 8y and spending a lot of time in the hospital     There are no problems to display for this patient.   Past Surgical History:  Procedure Laterality Date  . NO PAST SURGERIES         Home Medications    Prior to Admission medications   Medication Sig Start Date End Date Taking? Authorizing Provider  hydrOXYzine (ATARAX/VISTARIL) 25 MG tablet Take 1 tablet (25 mg total) by mouth 2 (two) times daily as needed for anxiety. 05/05/19   Renford Dills, NP  lisinopril (ZESTRIL) 10 MG tablet Take 10 mg by mouth daily.    [provider]  albuterol (VENTOLIN HFA) 108 (90 Base) MCG/ACT inhaler Inhale 2 puffs into the lungs every 4 (four) hours as needed for wheezing. 02/13/19 05/05/19  Verlee Monte, NP     Family History Family History  Problem Relation Age of Onset  . Healthy Mother   . Hypertension Father     Social History Social History   Tobacco Use  . Smoking status: Never Smoker  . Smokeless tobacco: Never Used  Vaping Use  . Vaping Use: Never used  Substance Use Topics  . Alcohol use: No  . Drug use: No     Allergies   Patient has no known allergies.   Review of Systems Review of Systems  Constitutional: Negative for activity change and appetite change.  Musculoskeletal: Positive for myalgias. Negative for arthralgias.  Skin: Negative for color change.  Neurological: Negative for weakness and numbness.  Hematological: Negative.   Psychiatric/Behavioral: The patient is nervous/anxious.      Physical Exam Triage Vital Signs ED Triage Vitals  Enc Vitals Group     BP 06/12/20 1208 (!) 160/85     Pulse Rate 06/12/20 1208 (S) (!) 150     Resp 06/12/20 1208 18     Temp 06/12/20 1208 98.5 F (36.9 C)     Temp Source 06/12/20 1208 Oral     SpO2 06/12/20 1208 98 %     Weight 06/12/20 1207 200 lb (90.7 kg)     Height 06/12/20 1207  6\' 1"  (1.854 m)     Head Circumference --      Peak Flow --      Pain Score 06/12/20 1206 4     Pain Loc --      Pain Edu? --      Excl. in GC? --    No data found.  Updated Vital Signs BP (!) 160/85 (BP Location: Left Arm)   Pulse (S) (!) 150 Comment: states that he is very anxious and feels like he is having a anxiety attack.  Temp 98.5 F (36.9 C) (Oral)   Resp 18   Ht 6\' 1"  (1.854 m)   Wt 200 lb (90.7 kg)   SpO2 98%   BMI 26.39 kg/m   Visual Acuity Right Eye Distance:   Left Eye Distance:   Bilateral Distance:    Right Eye Near:   Left Eye Near:    Bilateral Near:     Physical Exam Vitals and nursing note reviewed.  Constitutional:      General: He is not in acute distress.    Appearance: Normal appearance. He is normal weight. He is not ill-appearing.  HENT:     Head: Normocephalic and atraumatic.   Musculoskeletal:        General: Tenderness present. No swelling or deformity. Normal range of motion.  Skin:    General: Skin is warm and dry.     Capillary Refill: Capillary refill takes less than 2 seconds.     Findings: No bruising or erythema.  Neurological:     General: No focal deficit present.     Mental Status: He is alert and oriented to person, place, and time.     Motor: No weakness.  Psychiatric:        Mood and Affect: Mood normal.        Behavior: Behavior normal.        Thought Content: Thought content normal.        Judgment: Judgment normal.      UC Treatments / Results  Labs (all labs ordered are listed, but only abnormal results are displayed) Labs Reviewed - No data to display  EKG   Radiology No results found.  Procedures Procedures (including critical care time)  Medications Ordered in UC Medications - No data to display  Initial Impression / Assessment and Plan / UC Course  I have reviewed the triage vital signs and the nursing notes.  Pertinent labs & imaging results that were available during my care of the patient were reviewed by me and considered in my medical decision making (see chart for details).   Patient is a very pleasant but anxious 20 year old male here for evaluation of pain in bilateral antecubital fossa's after going to the gym.  Patient's exercise routine was excessive in the amount of sets and repetitions with varying weights for bicep exercises and tricep exercises.  Patient does have tenderness to palpation over the superior aspect of the antecubital fossa at the origin of the biceps muscles bilaterally. R>L.  Radial and ulnar pulses bilaterally are 2+.  Patient has mild tenderness to the body of the biceps muscle bilaterally.  No muscle defect noted.  No alterations to range of motion.  Suspect patient's pain is coming from overexertion of his muscles and lactic acid formation.  Discussed proper way to exercise, the need for  hydration, the need for stretching, as well as when and how to take anti-inflammatories.   Final Clinical Impressions(s) / UC Diagnoses  Final diagnoses:  Muscle soreness     Discharge Instructions     Increase your oral fluid intake to a goal of a gallon a day to help flush the lactic acid from your system and maintain hydration.  You can take 3 over-the-counter ibuprofen (600 mg) every 6 hours with food as needed for muscle soreness.  Do not take this before your exercise but you are fine to take this after your exercise.  Continue to stretch your muscles as we discussed to help pump the lactic acid from your muscles and improve healing.  You should also stretch prior to working out to prevent injury.  Massage therapy can also be effective in helping to remove the lactic acid and improve muscle soreness.  You may return to the gym at your leisure but I would recommend doing fewer reps and sets with lighter weight until you have build up some ability.    ED Prescriptions    None     PDMP not reviewed this encounter.   Becky Augusta, NP 06/12/20 1229

## 2020-06-12 NOTE — ED Triage Notes (Signed)
Patient complains of antecubital pain on both elbows. Patient states that he has been working out really hard for a while. States that pain has been ongoing for 3-5 days.

## 2020-06-12 NOTE — Discharge Instructions (Addendum)
Increase your oral fluid intake to a goal of a gallon a day to help flush the lactic acid from your system and maintain hydration.  You can take 3 over-the-counter ibuprofen (600 mg) every 6 hours with food as needed for muscle soreness.  Do not take this before your exercise but you are fine to take this after your exercise.  Continue to stretch your muscles as we discussed to help pump the lactic acid from your muscles and improve healing.  You should also stretch prior to working out to prevent injury.  Massage therapy can also be effective in helping to remove the lactic acid and improve muscle soreness.  You may return to the gym at your leisure but I would recommend doing fewer reps and sets with lighter weight until you have build up some ability.

## 2020-12-03 ENCOUNTER — Other Ambulatory Visit: Payer: Self-pay

## 2020-12-03 ENCOUNTER — Ambulatory Visit: Payer: BLUE CROSS/BLUE SHIELD | Admitting: Physician Assistant

## 2020-12-03 DIAGNOSIS — B04 Monkeypox: Secondary | ICD-10-CM

## 2020-12-03 DIAGNOSIS — Z113 Encounter for screening for infections with a predominantly sexual mode of transmission: Secondary | ICD-10-CM

## 2020-12-05 ENCOUNTER — Encounter: Payer: Self-pay | Admitting: Physician Assistant

## 2020-12-05 NOTE — Progress Notes (Signed)
[facility] Department STI clinic/screening visit  Subjective:  Christopher Andrade is a 20 y.o. male being seen today for an STI screening visit. The patient reports they do have symptoms.    Patient has the following medical conditions:  There are no problems to display for this patient.    Chief Complaint  Patient presents with   SEXUALLY TRANSMITTED DISEASE    screening    HPI  Patient reports that he recently changed both body wash/soap and detergent and broke out in a rash 2 days ago.  Reports that the rash started on his left hip/buttock area and then noticed it on his arms and right shoulder.  States that the rash is itchy and there are "bumps" in the area of the rash.  Reports that some of the bumps have since resolved and others are healing.  Reports that he has "severe/extreme" anxiety (generalized anxiety disorder), PTSD, OCD and HTN caused by his anxiety.  Reports that he has lisinopril to take for his blood pressure but that he only takes that when his anxiety is really high, like at MD appointments.  Reports that he has not had a HIV test and last void prior to sample collection for GC/Chlamydia testing was over 2 hr ago.  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   See flowsheet for further details and programmatic requirements.    The following portions of the patient's history were reviewed and updated as appropriate: allergies, current medications, past medical history, past social history, past surgical history and problem list.  Objective:  There were no vitals filed for this visit.  Physical Exam Constitutional:      General: He is not in acute distress.    Appearance: Normal appearance.  HENT:     Head: Normocephalic and atraumatic.     Comments: No nits,lice, or hair loss. No cervical, supraclavicular or axillary adenopathy.     Mouth/Throat:     Mouth: Mucous membranes are moist.     Pharynx: Oropharynx is clear.  No oropharyngeal exudate or posterior oropharyngeal erythema.  Eyes:     Conjunctiva/sclera: Conjunctivae normal.  Pulmonary:     Effort: Pulmonary effort is normal.  Abdominal:     Palpations: Abdomen is soft. There is no mass.     Tenderness: There is no abdominal tenderness. There is no guarding or rebound.  Musculoskeletal:     Cervical back: Neck supple. No tenderness.  Skin:    General: Skin is warm and dry.     Findings: Lesion and rash present. No bruising or erythema.     Comments: No bumps/lesions present at time of exam on buttock region.  Noted are widely scattered 1-2 mm papules on right shoulder, left hip and forearms bilaterally, non-tender, slightly erythematous base and easily cultured.  Neurological:     Mental Status: He is alert and oriented to person, place, and time.  Psychiatric:        Mood and Affect: Mood normal.        Behavior: Behavior normal.        Thought Content: Thought content normal.        Judgment: Judgment normal.      Assessment and Plan:  Christopher Andrade is a 20 y.o. male presenting to the [facility] for STI screening  1. Screening for STD (sexually transmitted disease) Patient into clinic with symptoms. Patient declines blood work today. Patient also declines  HSV testing today but does agree to pharyngeal and urine GC/Chlamydia testing as well as culture for monkeypox.  Counseled patient that we will likely have his results and call him in 3-4 days. No clear way to order monkeypox through [facility] today.  Will addend document when given correct ordering instruction. ( One sample taken from right shoulder area and second sample taken from left hip area) Rec condoms with all sex. Enc patient to try to stay with fragrance and dye free detergent/soap/body wash to prevent skin irritation. Await test results.  Counseled that RN will call if needs to [facility] for treatment once results are back.  - Chlamydia/Gonorrhea [facility] -  Chlamydia/Gonorrhea [facility]     No follow-ups on file.  No future appointments.  Matt Holmes, PA

## 2021-01-01 NOTE — Addendum Note (Signed)
Addended by: Geanie Berlin on: 01/01/2021 01:02 PM   Modules accepted: Orders

## 2023-03-18 ENCOUNTER — Telehealth: Payer: BC Managed Care – PPO | Admitting: Emergency Medicine

## 2023-03-18 DIAGNOSIS — R051 Acute cough: Secondary | ICD-10-CM

## 2023-03-18 MED ORDER — BENZONATATE 100 MG PO CAPS: 100.0000 mg | ORAL_CAPSULE | Freq: Two times a day (BID) | ORAL | 0 refills | Status: AC | PRN

## 2023-03-18 NOTE — Addendum Note (Signed)
Addended by: Roxy Horseman B on: 03/18/2023 04:37 PM   Modules accepted: Level of Service

## 2023-03-18 NOTE — Progress Notes (Signed)
Virtual Visit Consent   Christopher Andrade, you are scheduled for a virtual visit with a [facility] provider today. Just as with appointments in the office, your consent must be obtained to participate. Your consent will be active for this visit and any virtual visit you may have with one of our providers in the next 365 days. If you have a MyChart account, a copy of this consent can be sent to you electronically.  As this is a virtual visit, video technology does not allow for your provider to perform a traditional examination. This may limit your provider's ability to fully assess your condition. If your provider identifies any concerns that need to be evaluated in person or the need to arrange testing (such as labs, EKG, etc.), we will make arrangements to do so. Although advances in technology are sophisticated, we cannot ensure that it will always work on either your end or our end. If the connection with a video visit is poor, the visit may have to be switched to a telephone visit. With either a video or telephone visit, we are not always able to ensure that we have a secure connection.  By engaging in this virtual visit, you consent to the provision of healthcare and authorize for your insurance to be billed (if applicable) for the services provided during this visit. Depending on your insurance coverage, you may receive a charge related to this service.  I need to obtain your verbal consent now. Are you willing to proceed with your visit today? Christopher Andrade has provided verbal consent on 03/18/2023 for a virtual visit (video or telephone). Roxy Horseman, PA-C  Date: 03/18/2023 4:23 PM  Virtual Visit via Video Note   I, Roxy Horseman, connected with  Christopher Andrade  ([id number], [birth year]) on 03/18/23 at  4:15 PM EST by a video-enabled telemedicine application and verified that I am speaking with the correct person using two identifiers.  Location: Patient: Virtual Visit Location Patient:  Home Provider: Virtual Visit Location Provider: Home Office   I discussed the limitations of evaluation and management by telemedicine and the availability of in person appointments. The patient expressed understanding and agreed to proceed.    History of Present Illness: Christopher Andrade is a 22 y.o. who identifies as a male who was assigned male at birth, and is being seen today for cough and fever.  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  States that his symptoms just started today.  He measured his temperature at 99.6.  He has not taken a home Covid test.  He states that is anxious about his health and wants to make sure he doesn't get pneumonia.  He denies any other related symptoms.  HPI: HPI  Problems: There are no problems to display for this patient.   Allergies: No Known Allergies Medications:  Current Outpatient Medications:    benzonatate (TESSALON) 100 MG capsule, Take 1 capsule (100 mg total) by mouth 2 (two) times daily as needed for cough., Disp: 20 capsule, Rfl: 0   busPIRone (BUSPAR) 5 MG tablet, Take 5 mg by mouth 2 (two) times daily as needed., Disp: , Rfl:    methylphenidate 27 MG PO CR tablet, Take 27 mg by mouth every morning., Disp: , Rfl:    hydrOXYzine (ATARAX/VISTARIL) 25 MG tablet, Take 1 tablet (25 mg total) by mouth 2 (two) times daily as needed for anxiety., Disp: 20 tablet, Rfl: 0   lisinopril (ZESTRIL) 10 MG tablet, Take 10 mg by  mouth daily., Disp: , Rfl:    lisinopril (ZESTRIL) 10 MG tablet, Take 1 tablet by mouth daily., Disp: , Rfl:   Observations/Objective: Patient is well-developed, well-nourished in no acute distress.  Resting comfortably  at home.  Head is normocephalic, atraumatic.  No labored breathing.  Speech is clear and coherent with logical content.  Patient is alert and oriented at baseline.    Assessment and Plan: 1. Acute cough  Patients symptoms are consistent with URI, likely viral etiology. Discussed that antibiotics are not  indicated for viral infections. Pt will be discharged with symptomatic treatment.  Verbalizes understanding and is agreeable with plan. Pt is hemodynamically stable & in NAD prior to dc.   Meds ordered this encounter  Medications   benzonatate (TESSALON) 100 MG capsule    Sig: Take 1 capsule (100 mg total) by mouth 2 (two) times daily as needed for cough.    Dispense:  20 capsule    Refill:  0    Order Specific Question:   Supervising Provider    Answer:   Merrilee Jansky [id number]     Follow Up Instructions: I discussed the assessment and treatment plan with the patient. The patient was provided an opportunity to ask questions and all were answered. The patient agreed with the plan and demonstrated an understanding of the instructions.  A copy of instructions were sent to the patient via MyChart unless otherwise noted below.     The patient was advised to call back or seek an in-person evaluation if the symptoms worsen or if the condition fails to improve as anticipated.    Roxy Horseman, PA-C

## 2023-03-18 NOTE — Patient Instructions (Signed)
  Regino Bellow, thank you for joining Roxy Horseman, PA-C for today's virtual visit.  While this provider is not your primary care provider (PCP), if your PCP is located in our provider database this encounter information will be shared with them immediately following your visit.   A [facility] MyChart account gives you access to today's visit and all your visits, tests, and labs performed at [facility] " click here if you don't have a [facility] MyChart account or go to [email or web address]  Consent: (Patient) Christopher Andrade provided verbal consent for this virtual visit at the beginning of the encounter.  Current Medications:  Current Outpatient Medications:    benzonatate (TESSALON) 100 MG capsule, Take 1 capsule (100 mg total) by mouth 2 (two) times daily as needed for cough., Disp: 20 capsule, Rfl: 0   busPIRone (BUSPAR) 5 MG tablet, Take 5 mg by mouth 2 (two) times daily as needed., Disp: , Rfl:    methylphenidate 27 MG PO CR tablet, Take 27 mg by mouth every morning., Disp: , Rfl:    hydrOXYzine (ATARAX/VISTARIL) 25 MG tablet, Take 1 tablet (25 mg total) by mouth 2 (two) times daily as needed for anxiety., Disp: 20 tablet, Rfl: 0   lisinopril (ZESTRIL) 10 MG tablet, Take 10 mg by mouth daily., Disp: , Rfl:    lisinopril (ZESTRIL) 10 MG tablet, Take 1 tablet by mouth daily., Disp: , Rfl:    Medications ordered in this encounter:  Meds ordered this encounter  Medications   benzonatate (TESSALON) 100 MG capsule    Sig: Take 1 capsule (100 mg total) by mouth 2 (two) times daily as needed for cough.    Dispense:  20 capsule    Refill:  0    Order Specific Question:   Supervising Provider    Answer:   Merrilee Jansky [id number]     *If you need refills on other medications prior to your next appointment, please contact your pharmacy*  Follow-Up: Call back or seek an in-person evaluation if the symptoms worsen or if the condition fails to improve as  anticipated.  [facility] [phone]  Other Instructions    If you have been instructed to have an in-person evaluation today at a local Urgent Care facility, please use the link below. It will take you to a list of all of our available [facility], including address, phone number and hours of operation. Please do not delay care.  [facility]  If you or a family member do not have a primary care provider, use the link below to schedule a visit and establish care. When you choose a [facility] primary care physician or advanced practice provider, you gain a long-term partner in health. Find a Primary Care Provider  Learn more about [facility] in-office and virtual care options: [facility] - Get Care Now

## 2023-03-20 ENCOUNTER — Telehealth: Payer: BC Managed Care – PPO | Admitting: Family Medicine

## 2023-03-20 DIAGNOSIS — J40 Bronchitis, not specified as acute or chronic: Secondary | ICD-10-CM

## 2023-03-20 MED ORDER — PROMETHAZINE-DM 6.25-15 MG/5ML PO SYRP: 5.0000 mL | ORAL_SOLUTION | Freq: Four times a day (QID) | ORAL | 0 refills | Status: AC | PRN

## 2023-03-20 MED ORDER — AZITHROMYCIN 250 MG PO TABS: ORAL_TABLET | ORAL | 0 refills | Status: AC

## 2023-03-20 NOTE — Patient Instructions (Signed)

## 2023-03-20 NOTE — Progress Notes (Signed)
Virtual Visit Consent   Christopher Andrade, you are scheduled for a virtual visit with a [facility] provider today. Just as with appointments in the office, your consent must be obtained to participate. Your consent will be active for this visit and any virtual visit you may have with one of our providers in the next 365 days. If you have a MyChart account, a copy of this consent can be sent to you electronically.  As this is a virtual visit, video technology does not allow for your provider to perform a traditional examination. This may limit your provider's ability to fully assess your condition. If your provider identifies any concerns that need to be evaluated in person or the need to arrange testing (such as labs, EKG, etc.), we will make arrangements to do so. Although advances in technology are sophisticated, we cannot ensure that it will always work on either your end or our end. If the connection with a video visit is poor, the visit may have to be switched to a telephone visit. With either a video or telephone visit, we are not always able to ensure that we have a secure connection.  By engaging in this virtual visit, you consent to the provision of healthcare and authorize for your insurance to be billed (if applicable) for the services provided during this visit. Depending on your insurance coverage, you may receive a charge related to this service.  I need to obtain your verbal consent now. Are you willing to proceed with your visit today? Christopher Andrade has provided verbal consent on 03/20/2023 for a virtual visit (video or telephone). Georgana Curio, FNP  Date: 03/20/2023 6:36 PM  Virtual Visit via Video Note   I, Georgana Curio, connected with  Christopher Andrade  ([id number], 2001/03/03) on 03/20/23 at  6:30 PM EST by a video-enabled telemedicine application and verified that I am speaking with the correct person using two identifiers.  Location: Patient: Virtual Visit Location Patient:  Home Provider: Virtual Visit Location Provider: Home Office   I discussed the limitations of evaluation and management by telemedicine and the availability of in person appointments. The patient expressed understanding and agreed to proceed.    History of Present Illness: Christopher Andrade is a 21 y.o. who identifies as a male who was assigned male at birth, and is being seen today for cough worsening. He was seen 2 days ago and told to call back if sx worsen. He reports fever continues along with non prod cough. No wheezing or sob. Marland Kitchen  HPI: HPI  Problems: There are no problems to display for this patient.   Allergies: No Known Allergies Medications:  Current Outpatient Medications:    benzonatate (TESSALON) 100 MG capsule, Take 1 capsule (100 mg total) by mouth 2 (two) times daily as needed for cough., Disp: 20 capsule, Rfl: 0   busPIRone (BUSPAR) 5 MG tablet, Take 5 mg by mouth 2 (two) times daily as needed., Disp: , Rfl:    hydrOXYzine (ATARAX/VISTARIL) 25 MG tablet, Take 1 tablet (25 mg total) by mouth 2 (two) times daily as needed for anxiety., Disp: 20 tablet, Rfl: 0   lisinopril (ZESTRIL) 10 MG tablet, Take 10 mg by mouth daily., Disp: , Rfl:    lisinopril (ZESTRIL) 10 MG tablet, Take 1 tablet by mouth daily., Disp: , Rfl:    methylphenidate 27 MG PO CR tablet, Take 27 mg by mouth every morning., Disp: , Rfl:   Observations/Objective: Patient is well-developed, well-nourished in  no acute distress.  Resting comfortably  at home.  Head is normocephalic, atraumatic.  No labored breathing.  Speech is clear and coherent with logical content.  Patient is alert and oriented at baseline.  In no distress.   Assessment and Plan: 1. Bronchitis  Increase fluids, humidifier at night, tylenol or ibuprofen as directed, UC if sx persist or worsen.   Follow Up Instructions: I discussed the assessment and treatment plan with the patient. The patient was provided an opportunity to ask questions  and all were answered. The patient agreed with the plan and demonstrated an understanding of the instructions.  A copy of instructions were sent to the patient via MyChart unless otherwise noted below.     The patient was advised to call back or seek an in-person evaluation if the symptoms worsen or if the condition fails to improve as anticipated.    Georgana Curio, FNP

## 2023-05-22 ENCOUNTER — Telehealth: Payer: Self-pay | Admitting: Nurse Practitioner

## 2023-05-22 DIAGNOSIS — J069 Acute upper respiratory infection, unspecified: Secondary | ICD-10-CM

## 2023-05-22 MED ORDER — PROMETHAZINE-DM 6.25-15 MG/5ML PO SYRP: 5.0000 mL | ORAL_SOLUTION | Freq: Four times a day (QID) | ORAL | 0 refills | Status: AC | PRN

## 2023-05-22 MED ORDER — FLUTICASONE PROPIONATE 50 MCG/ACT NA SUSP: 2.0000 | Freq: Every day | NASAL | 6 refills | Status: AC

## 2023-05-22 MED ORDER — ALBUTEROL SULFATE HFA 108 (90 BASE) MCG/ACT IN AERS: 2.0000 | INHALATION_SPRAY | Freq: Four times a day (QID) | RESPIRATORY_TRACT | 0 refills | Status: AC | PRN

## 2023-05-22 MED ORDER — CETIRIZINE HCL 10 MG PO TABS: 10.0000 mg | ORAL_TABLET | Freq: Every day | ORAL | 2 refills | Status: AC

## 2023-05-22 NOTE — Progress Notes (Signed)
Virtual Visit Consent   Christopher Andrade, you are scheduled for a virtual visit with a [facility] provider today. Just as with appointments in the office, your consent must be obtained to participate. Your consent will be active for this visit and any virtual visit you may have with one of our providers in the next 365 days. If you have a MyChart account, a copy of this consent can be sent to you electronically.  As this is a virtual visit, video technology does not allow for your provider to perform a traditional examination. This may limit your provider's ability to fully assess your condition. If your provider identifies any concerns that need to be evaluated in person or the need to arrange testing (such as labs, EKG, etc.), we will make arrangements to do so. Although advances in technology are sophisticated, we cannot ensure that it will always work on either your end or our end. If the connection with a video visit is poor, the visit may have to be switched to a telephone visit. With either a video or telephone visit, we are not always able to ensure that we have a secure connection.  By engaging in this virtual visit, you consent to the provision of healthcare and authorize for your insurance to be billed (if applicable) for the services provided during this visit. Depending on your insurance coverage, you may receive a charge related to this service.  I need to obtain your verbal consent now. Are you willing to proceed with your visit today? Christopher Andrade has provided verbal consent on 05/22/2023 for a virtual visit (video or telephone). Viviano Simas, FNP  Date: 05/22/2023 7:25 PM  Virtual Visit via Video Note   I, Viviano Simas, connected with  Christopher Andrade  ([id number], August 29, 2000) on 05/22/23 at  7:30 PM EST by a video-enabled telemedicine application and verified that I am speaking with the correct person using two identifiers.  Location: Patient: Virtual Visit Location Patient:  Home Provider: Virtual Visit Location Provider: Home Office   I discussed the limitations of evaluation and management by telemedicine and the availability of in person appointments. The patient expressed understanding and agreed to proceed.    History of Present Illness: Christopher Andrade is a 23 y.o. who identifies as a male who was assigned male at birth, and is being seen today for sinus symptoms that are new in the pat 3 days. 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   Allergies: No Known Allergies Medications:  Current Outpatient Medications:    benzonatate (TESSALON) 100 MG capsule, Take 1 capsule (100 mg total) by mouth 2 (two) times daily as needed for cough., Disp: 20 capsule, Rfl: 0   busPIRone (BUSPAR) 5 MG tablet, Take 5 mg by mouth 2 (two) times daily as needed., Disp: , Rfl:    hydrOXYzine (ATARAX/VISTARIL) 25 MG tablet, Take 1 tablet (25 mg total) by mouth 2 (two) times daily as needed for anxiety., Disp: 20 tablet, Rfl: 0   lisinopril (ZESTRIL)  10 MG tablet, Take 10 mg by mouth daily., Disp: , Rfl:    lisinopril (ZESTRIL) 10 MG tablet, Take 1 tablet by mouth daily., Disp: , Rfl:    methylphenidate 27 MG PO CR tablet, Take 27 mg by mouth every morning., Disp: , Rfl:   Observations/Objective: Patient is well-developed, well-nourished in no acute distress.  Resting comfortably  at home.  Head is normocephalic, atraumatic.  No labored breathing.  Speech is clear and coherent with logical content.  Patient is alert and oriented at baseline.    Assessment and Plan:  1. Viral URI  (Primary)  - cetirizine (ZYRTEC ALLERGY) 10 MG tablet; Take 1 tablet (10 mg total) by mouth at bedtime.  Dispense: 30 tablet; Refill: 2  - fluticasone (FLONASE) 50 MCG/ACT nasal spray; Place 2 sprays into both nostrils daily.  Dispense: 16 g; Refill: 6  - albuterol (VENTOLIN HFA) 108 (90 Base) MCG/ACT inhaler; Inhale 2 puffs into the lungs every 6 (six) hours as needed for wheezing or shortness of breath.  Dispense: 8 g; Refill: 0  - promethazine-dextromethorphan (PROMETHAZINE-DM) 6.25-15 MG/5ML syrup; Take 5 mLs by mouth 4 (four) times daily as needed for cough.  Dispense: 118 mL; Refill: 0     Follow Up Instructions: I discussed the assessment and treatment plan with the patient. The patient was provided an opportunity to ask questions and all were answered. The patient agreed with the plan and demonstrated an understanding of the instructions.  A copy of instructions were sent to the patient via MyChart unless otherwise noted below.    The patient was advised to call back or seek an in-person evaluation if the symptoms worsen or if the condition fails to improve as anticipated.    Viviano Simas, FNP

## 2023-05-23 ENCOUNTER — Encounter: Payer: Self-pay | Admitting: Nurse Practitioner

## 2023-06-26 ENCOUNTER — Telehealth: Payer: Self-pay | Admitting: Emergency Medicine

## 2023-06-26 DIAGNOSIS — R112 Nausea with vomiting, unspecified: Secondary | ICD-10-CM

## 2023-06-26 MED ORDER — ONDANSETRON 4 MG PO TBDP: 4.0000 mg | ORAL_TABLET | Freq: Three times a day (TID) | ORAL | 0 refills | Status: AC | PRN

## 2023-06-26 NOTE — Progress Notes (Signed)
Virtual Visit Consent   [name], you are scheduled for a virtual visit with a [facility] provider today. Just as with appointments in the office, your consent must be obtained to participate. Your consent will be active for this visit and any virtual visit you may have with one of our providers in the next 365 days. If you have a MyChart account, a copy of this consent can be sent to you electronically.  As this is a virtual visit, video technology does not allow for your provider to perform a traditional examination. This may limit your provider's ability to fully assess your condition. If your provider identifies any concerns that need to be evaluated in person or the need to arrange testing (such as labs, EKG, etc.), we will make arrangements to do so. Although advances in technology are sophisticated, we cannot ensure that it will always work on either your end or our end. If the connection with a video visit is poor, the visit may have to be switched to a telephone visit. With either a video or telephone visit, we are not always able to ensure that we have a secure connection.  By engaging in this virtual visit, you consent to the provision of healthcare and authorize for your insurance to be billed (if applicable) for the services provided during this visit. Depending on your insurance coverage, you may receive a charge related to this service.  I need to obtain your verbal consent now. Are you willing to proceed with your visit today? Christopher Andrade has provided verbal consent on 06/26/2023 for a virtual visit (video or telephone). Christopher Horseman, PA-C  Date: 06/26/2023 11:21 AM   Virtual Visit via Video Note   I, Christopher Andrade, connected with  Christopher Andrade  ([id number], 20-Sep-2000) on 06/26/23 at 11:15 AM EST by a video-enabled telemedicine application and verified that I am speaking with the correct person using two identifiers.  Location: Patient: Virtual Visit Location Patient:  Home Provider: Virtual Visit Location Provider: Home Office   I discussed the limitations of evaluation and management by telemedicine and the availability of in person appointments. The patient expressed understanding and agreed to proceed.    History of Present Illness: Christopher Andrade is a 23 y.o. who identifies as a male who was assigned male at birth, and is being seen today for nausea and vomiting.  States that symptoms started last night around 11:30pm.  States that he took a dramamine and has been feeling slightly better.  He denies fever or focal abdominal pain.  He hasn't had diarrhea.  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  HPI: HPI  Problems: There are no active problems to display for this patient.   Allergies: No Known Allergies Medications:  Current Outpatient Medications:    ondansetron (ZOFRAN-ODT) 4 MG disintegrating tablet, Take 1 tablet (4 mg total) by mouth every 8 (eight) hours as needed for nausea or vomiting., Disp: 15 tablet, Rfl: 0   albuterol (VENTOLIN HFA) 108 (90 Base) MCG/ACT inhaler, Inhale 2 puffs into the lungs every 6 (six) hours as needed for wheezing or shortness of breath., Disp: 8 g, Rfl: 0   benzonatate (TESSALON) 100 MG capsule, Take 1 capsule (100 mg total) by mouth 2 (two) times daily as needed for cough., Disp: 20 capsule, Rfl: 0   busPIRone (BUSPAR) 5 MG tablet, Take 5 mg by mouth 2 (two) times daily as needed., Disp: , Rfl:    cetirizine (ZYRTEC  ALLERGY) 10 MG tablet, Take 1 tablet (10 mg total) by mouth at bedtime., Disp: 30 tablet, Rfl: 2   fluticasone (FLONASE) 50 MCG/ACT nasal spray, Place 2 sprays into both nostrils daily., Disp: 16 g, Rfl: 6   hydrOXYzine (ATARAX/VISTARIL) 25 MG tablet, Take 1 tablet (25 mg total) by mouth 2 (two) times daily as needed for anxiety., Disp: 20 tablet, Rfl: 0   lisinopril (ZESTRIL) 10 MG tablet, Take 10 mg by mouth daily., Disp: , Rfl:    lisinopril (ZESTRIL) 10 MG tablet, Take 1 tablet by mouth  daily., Disp: , Rfl:    methylphenidate 27 MG PO CR tablet, Take 27 mg by mouth every morning., Disp: , Rfl:    promethazine-dextromethorphan (PROMETHAZINE-DM) 6.25-15 MG/5ML syrup, Take 5 mLs by mouth 4 (four) times daily as needed for cough., Disp: 118 mL, Rfl: 0  Observations/Objective: Patient is well-developed, well-nourished in no acute distress.  Resting comfortably  at home.  Head is normocephalic, atraumatic.  No labored breathing.  Speech is clear and coherent with logical content.  Patient is alert and oriented at baseline.    Assessment and Plan: 1. Nausea and vomiting, unspecified vomiting type (Primary)  Meds ordered this encounter  Medications   ondansetron (ZOFRAN-ODT) 4 MG disintegrating tablet    Sig: Take 1 tablet (4 mg total) by mouth every 8 (eight) hours as needed for nausea or vomiting.    Dispense:  15 tablet    Refill:  0    Supervising Provider:   Merrilee Jansky [id number]   Patient with nausea and vomiting since last night.  Starting to slow down on vomiting.  Requesting anti-emetic.  He doesn't having focal abdominal pain or fever.  He does work in child care, so exposure to norovirus is certainly probable with the prevalence in the community right now.  I've recommended zofran and follow-up if not improving.   Follow Up Instructions: I discussed the assessment and treatment plan with the patient. The patient was provided an opportunity to ask questions and all were answered. The patient agreed with the plan and demonstrated an understanding of the instructions.  A copy of instructions were sent to the patient via MyChart unless otherwise noted below.     The patient was advised to call back or seek an in-person evaluation if the symptoms worsen or if the condition fails to improve as anticipated.    Christopher Horseman, PA-C

## 2023-06-26 NOTE — Patient Instructions (Signed)
Regino Bellow, thank you for joining Roxy Horseman, PA-C for today's virtual visit.  While this provider is not your primary care provider (PCP), if your PCP is located in our provider database this encounter information will be shared with them immediately following your visit.   A [facility] MyChart account gives you access to today's visit and all your visits, tests, and labs performed at [facility] " click here if you don't have a [facility] MyChart account or go to [email or web address]  Consent: (Patient) Christopher Andrade provided verbal consent for this virtual visit at the beginning of the encounter.  Current Medications:  Current Outpatient Medications:    ondansetron (ZOFRAN-ODT) 4 MG disintegrating tablet, Take 1 tablet (4 mg total) by mouth every 8 (eight) hours as needed for nausea or vomiting., Disp: 15 tablet, Rfl: 0   albuterol (VENTOLIN HFA) 108 (90 Base) MCG/ACT inhaler, Inhale 2 puffs into the lungs every 6 (six) hours as needed for wheezing or shortness of breath., Disp: 8 g, Rfl: 0   benzonatate (TESSALON) 100 MG capsule, Take 1 capsule (100 mg total) by mouth 2 (two) times daily as needed for cough., Disp: 20 capsule, Rfl: 0   busPIRone (BUSPAR) 5 MG tablet, Take 5 mg by mouth 2 (two) times daily as needed., Disp: , Rfl:    cetirizine (ZYRTEC ALLERGY) 10 MG tablet, Take 1 tablet (10 mg total) by mouth at bedtime., Disp: 30 tablet, Rfl: 2   fluticasone (FLONASE) 50 MCG/ACT nasal spray, Place 2 sprays into both nostrils daily., Disp: 16 g, Rfl: 6   hydrOXYzine (ATARAX/VISTARIL) 25 MG tablet, Take 1 tablet (25 mg total) by mouth 2 (two) times daily as needed for anxiety., Disp: 20 tablet, Rfl: 0   lisinopril (ZESTRIL) 10 MG tablet, Take 10 mg by mouth daily., Disp: , Rfl:    lisinopril (ZESTRIL) 10 MG tablet, Take 1 tablet by mouth daily., Disp: , Rfl:    methylphenidate 27 MG PO CR tablet, Take 27 mg by mouth every morning., Disp: , Rfl:     promethazine-dextromethorphan (PROMETHAZINE-DM) 6.25-15 MG/5ML syrup, Take 5 mLs by mouth 4 (four) times daily as needed for cough., Disp: 118 mL, Rfl: 0   Medications ordered in this encounter:  Meds ordered this encounter  Medications   ondansetron (ZOFRAN-ODT) 4 MG disintegrating tablet    Sig: Take 1 tablet (4 mg total) by mouth every 8 (eight) hours as needed for nausea or vomiting.    Dispense:  15 tablet    Refill:  0    Supervising Provider:   Merrilee Jansky [[id number]]     *If you need refills on other medications prior to your next appointment, please contact your pharmacy*  Follow-Up: Call back or seek an in-person evaluation if the symptoms worsen or if the condition fails to improve as anticipated.  [facility] [phone]  Other Instructions    If you have been instructed to have an in-person evaluation today at a local Urgent Care facility, please use the link below. It will take you to a list of all of our available [facility], including address, phone number and hours of operation. Please do not delay care.  [facility]  If you or a family member do not have a primary care provider, use the link below to schedule a visit and establish care. When you choose a [facility] primary care physician or advanced practice provider, you gain a long-term partner in  health. Find a Primary Care Provider  Learn more about [facility] in-office and virtual care options: [facility] Get [facility] Now

## 2023-09-18 ENCOUNTER — Telehealth: Admitting: Nurse Practitioner

## 2023-09-18 DIAGNOSIS — J069 Acute upper respiratory infection, unspecified: Secondary | ICD-10-CM

## 2023-09-18 MED ORDER — IPRATROPIUM BROMIDE 0.03 % NA SOLN: 2.0000 | Freq: Two times a day (BID) | NASAL | 12 refills | Status: AC

## 2023-09-18 MED ORDER — BENZONATATE 100 MG PO CAPS: 100.0000 mg | ORAL_CAPSULE | Freq: Three times a day (TID) | ORAL | 0 refills | Status: AC | PRN

## 2023-09-18 NOTE — Progress Notes (Signed)

## 2023-09-26 ENCOUNTER — Telehealth: Admitting: Family Medicine

## 2023-09-26 DIAGNOSIS — B09 Unspecified viral infection characterized by skin and mucous membrane lesions: Secondary | ICD-10-CM | POA: Diagnosis not present

## 2023-09-26 NOTE — Patient Instructions (Signed)
 Rash, Adult A rash is a breakout of spots or blotches on the skin. It can affect the way the skin looks and feels. Many things can cause a rash. Common causes include: Viral infections. These include colds, measles, and hand, foot, and mouth disease. Bacterial infections. These include scarlet fever and impetigo. Fungal infections. These include athlete's foot, ringworm, and yeast rashes. Skin irritation. This may be from heat rash, exposure to moisture or friction for a long time (intertrigo), or exposure to soap or skin care products (eczema). Allergic reactions. These may be caused by foods, medicines, or things like poison ivy. Some rashes may go away after a few days. Others may last for a few weeks. The goal of treatment is to stop the itching and keep the rash from spreading. Follow these instructions at home: Medicine Take or apply over-the-counter and prescription medicines only as told by your health care provider. These may include: Corticosteroids. These can help treat red or swollen skin. They may be given as creams or as medicines to take by mouth (orally). Anti-itch lotions. Allergy medicines. Pain medicine. Antifungal medicine if the rash is from a fungal infection. Antibiotics if you have an infection.  Skin care Apply cool, wet cloths (compresses) to the affected areas. Do not scratch or rub your skin. Avoid covering the rash. Keep it exposed to air as often as you can. Managing itching and discomfort Avoid hot showers and baths. These can make itching worse. A cold shower may help. Try taking a bath with: Epsom salts. You can get these at your local pharmacy or grocery store. Follow the instructions on the package. Baking soda. Pour a small amount into the bath as told by your provider. Colloidal oatmeal. You can get this at your local pharmacy or grocery store. Follow the instructions on the package. Try putting baking soda paste on your skin. Stir water into baking  soda until it becomes like a paste. Try using calamine lotion or cortisone cream to help with itchiness. Keep cool. Stay out of the sun. Sweating and being hot can make itching worse. General instructions  Rest as needed. Drink enough fluid to keep your pee (urine) pale yellow. Wear loose-fitting clothes. Avoid scented soaps, detergents, and perfumes. Use gentle soaps, detergents, perfumes, and cosmetics. Avoid the things that cause your rash (triggers). Keep a journal to help keep track of your triggers. Write down: What you eat. What cosmetics you use. What you drink. What you wear. This includes jewelry. Contact a health care provider if: You sweat at night more than normal. You pee (urinate) more or less than normal, or your pee is a darker color than normal. Your eyes become sensitive to light. Your skin or the white parts of your eyes turn yellow (jaundice). Your skin tingles or is numb. You get painful blisters in your nose or mouth. Your rash does not go away after a few days, or it gets worse. You are more tired or thirsty than normal. You have new or worse symptoms. These may include: Pain in your abdomen. Fever. Diarrhea or vomiting. Weakness or weight loss. Get help right away if: You get confused. You have a severe headache, a stiff neck, or severe joint pain or stiffness. You become very sleepy or not responsive. You have a seizure. This information is not intended to replace advice given to you by your health care provider. Make sure you discuss any questions you have with your health care provider. Document Revised: 02/07/2022 Document Reviewed:  02/07/2022 Elsevier Patient Education  2024 [employer].

## 2023-09-26 NOTE — Progress Notes (Signed)
 Virtual Visit Consent   Christopher Andrade, you are scheduled for a virtual visit with a [facility] provider today. Just as with appointments in the office, your consent must be obtained to participate. Your consent will be active for this visit and any virtual visit you may have with one of our providers in the next 365 days. If you have a MyChart account, a copy of this consent can be sent to you electronically.  As this is a virtual visit, video technology does not allow for your provider to perform a traditional examination. This may limit your provider's ability to fully assess your condition. If your provider identifies any concerns that need to be evaluated in person or the need to arrange testing (such as labs, EKG, etc.), we will make arrangements to do so. Although advances in technology are sophisticated, we cannot ensure that it will always work on either your end or our end. If the connection with a video visit is poor, the visit may have to be switched to a telephone visit. With either a video or telephone visit, we are not always able to ensure that we have a secure connection.  By engaging in this virtual visit, you consent to the provision of healthcare and authorize for your insurance to be billed (if applicable) for the services provided during this visit. Depending on your insurance coverage, you may receive a charge related to this service.  I need to obtain your verbal consent now. Are you willing to proceed with your visit today? [name] has provided verbal consent on 09/26/2023 for a virtual visit (video or telephone). [name], FNP  Date: 09/26/2023 1:06 PM   Virtual Visit via Video Note   I, [name], connected with  Christopher Andrade  ([id number], 2000/12/31) on 09/26/23 at  1:00 PM EDT by a video-enabled telemedicine application and verified that I am speaking with the correct person using two identifiers.  Location: Patient: Virtual Visit Location Patient:  Home Provider: Virtual Visit Location Provider: Home Office   I discussed the limitations of evaluation and management by telemedicine and the availability of in person appointments. The patient expressed understanding and agreed to proceed.    History of Present Illness: Christopher Andrade is a 23 y.o. who identifies as a male who was assigned male at birth, and is being seen today for a rash after having a URI last week treated with tessalon  no antibiotics. He says it is across chest and upper arms. 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 Fever last Saturday. Maybe itches he says. Difficult to visualize over video.   HPI: HPI  Problems: There are no active problems to display for this patient.   Allergies: No Known Allergies Medications:  Current Outpatient Medications:    albuterol  (VENTOLIN  HFA) 108 (90 Base) MCG/ACT inhaler, Inhale 2 puffs into the lungs every 6 (six) hours as needed for wheezing or shortness of breath., Disp: 8 g, Rfl: 0   benzonatate  (TESSALON ) 100 MG capsule, Take 1 capsule (100 mg total) by mouth 2 (two) times daily as needed for cough., Disp: 20 capsule, Rfl: 0   benzonatate  (TESSALON ) 100 MG capsule, Take 1 capsule (100 mg total) by mouth 3 (three) times daily as needed., Disp: 30 capsule, Rfl: 0   busPIRone (BUSPAR) 5 MG tablet, Take 5 mg by mouth 2 (two) times daily as needed., Disp: , Rfl:    cetirizine  (ZYRTEC  ALLERGY) 10 MG tablet, Take 1 tablet (10 mg total) by  mouth at bedtime., Disp: 30 tablet, Rfl: 2   fluticasone  (FLONASE ) 50 MCG/ACT nasal spray, Place 2 sprays into both nostrils daily., Disp: 16 g, Rfl: 6   hydrOXYzine  (ATARAX /VISTARIL ) 25 MG tablet, Take 1 tablet (25 mg total) by mouth 2 (two) times daily as needed for anxiety., Disp: 20 tablet, Rfl: 0   ipratropium (ATROVENT ) 0.03 % nasal spray, Place 2 sprays into both nostrils every 12 (twelve) hours., Disp: 30 mL, Rfl: 12   lisinopril (ZESTRIL) 10 MG tablet, Take 10 mg by mouth daily., Disp: , Rfl:     lisinopril (ZESTRIL) 10 MG tablet, Take 1 tablet by mouth daily., Disp: , Rfl:    methylphenidate 27 MG PO CR tablet, Take 27 mg by mouth every morning., Disp: , Rfl:    ondansetron  (ZOFRAN -ODT) 4 MG disintegrating tablet, Take 1 tablet (4 mg total) by mouth every 8 (eight) hours as needed for nausea or vomiting., Disp: 15 tablet, Rfl: 0   promethazine -dextromethorphan (PROMETHAZINE -DM) 6.25-15 MG/5ML syrup, Take 5 mLs by mouth 4 (four) times daily as needed for cough., Disp: 118 mL, Rfl: 0  Observations/Objective: Patient is well-developed, well-nourished in no acute distress.  Resting comfortably  at home.  Head is normocephalic, atraumatic.  No labored breathing.  Speech is clear and coherent with logical content.  Patient is alert and oriented at baseline.    Assessment and Plan: 1. Viral rash (Primary)  Start allegra otc daily, hydrocortiosone, monitor and sched another visit if it worsens.   Follow Up Instructions: I discussed the assessment and treatment plan with the patient. The patient was provided an opportunity to ask questions and all were answered. The patient agreed with the plan and demonstrated an understanding of the instructions.  A copy of instructions were sent to the patient via MyChart unless otherwise noted below.     The patient was advised to call back or seek an in-person evaluation if the symptoms worsen or if the condition fails to improve as anticipated.    [name], FNP
